# Patient Record
Sex: Female | Born: 1965 | Race: Black or African American | Hispanic: No | Marital: Single | Smoking: Former smoker
Health system: Southern US, Community
[De-identification: ages and names within clinical notes are randomized; demographics above are authoritative.]

## PROBLEM LIST (undated history)

## (undated) DIAGNOSIS — I10 Essential (primary) hypertension: Secondary | ICD-10-CM

## (undated) DIAGNOSIS — R519 Headache, unspecified: Secondary | ICD-10-CM

## (undated) DIAGNOSIS — G8929 Other chronic pain: Secondary | ICD-10-CM

## (undated) DIAGNOSIS — H9191 Unspecified hearing loss, right ear: Secondary | ICD-10-CM

## (undated) DIAGNOSIS — I639 Cerebral infarction, unspecified: Secondary | ICD-10-CM

## (undated) DIAGNOSIS — F32A Depression, unspecified: Secondary | ICD-10-CM

## (undated) DIAGNOSIS — F419 Anxiety disorder, unspecified: Secondary | ICD-10-CM

## (undated) DIAGNOSIS — M1990 Unspecified osteoarthritis, unspecified site: Secondary | ICD-10-CM

## (undated) HISTORY — PX: EXPLORATORY LAPAROTOMY: SUR591

## (undated) HISTORY — PX: ABDOMINAL HYSTERECTOMY: SUR658

## (undated) HISTORY — PX: CERVICAL SPINE SURGERY: SHX589

## (undated) HISTORY — DX: Depression, unspecified: F32.A

## (undated) HISTORY — DX: Headache, unspecified: R51.9

## (undated) HISTORY — DX: Other chronic pain: G89.29

## (undated) HISTORY — PX: OTHER SURGICAL HISTORY: SHX169

## (undated) HISTORY — PX: CHOLECYSTECTOMY: SHX55

## (undated) HISTORY — DX: Unspecified osteoarthritis, unspecified site: M19.90

## (undated) HISTORY — DX: Unspecified hearing loss, right ear: H91.91

## (undated) HISTORY — PX: BUNIONECTOMY: SHX129

## (undated) HISTORY — PX: REPLACEMENT TOTAL KNEE: SUR1224

## (undated) HISTORY — PX: WRIST SURGERY: SHX841

## (undated) HISTORY — DX: Cerebral infarction, unspecified: I63.9

## (undated) HISTORY — DX: Essential (primary) hypertension: I10

## (undated) HISTORY — DX: Anxiety disorder, unspecified: F41.9

## (undated) NOTE — Telephone Encounter (Signed)
 Formatting of this note is different from the original. Scientist, research (physical sciences) Outreach - Fit Kit was mailed over 2 months ago  Health Maintenance Due  Topic Date Due  ? Hepatitis C Screening  Never done  ? Covid-19 Vaccine (1 of 2) Never done  ? Meningitis (MEN B) vaccine (3 of 4 - Increased Risk Bexsero 2-dose series) 11/01/2016  ? Pneumococcal vaccine (3 of 4 - PPSV23) 04/18/2017  ? Shingrix vaccine (1 of 2) Never done  ? Colorectal Cancer Screening  09/19/2018  ? Monthly PHQ-9 Screening  11/17/2019  ? Flu Vaccine (1) 02/26/2020   As of today, we show that you are due/overdue for your annual preventative colorectal cancer screening.  [facility] recently mailed you an envelope with a FIT kit for you to complete this screening. Do you still have it?   N/A - Outreach Unsuccessful  Outcome: Member declined appt, complete/return fit kit, a1c and bp check one day next wk,   Electronically signed by [employer], Dawn E at 03/26/2020  4:16 PM EDT
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
## 2013-06-27 DIAGNOSIS — I639 Cerebral infarction, unspecified: Secondary | ICD-10-CM

## 2013-06-27 HISTORY — DX: Cerebral infarction, unspecified: I63.9

## 2020-01-17 ENCOUNTER — Emergency Department (HOSPITAL_COMMUNITY)
Admission: EM | Admit: 2020-01-17 | Discharge: 2020-01-17 | Discharge status: Against Medical Advice | Payer: BLUE CROSS/BLUE SHIELD

## 2020-01-17 NOTE — ED Notes (Signed)
Called pt again for triage with no response

## 2020-01-17 NOTE — ED Notes (Signed)
Called pt x5 for triage, no response. 

## 2020-05-13 ENCOUNTER — Other Ambulatory Visit: Payer: Self-pay | Admitting: Internal Medicine

## 2020-05-13 DIAGNOSIS — N6320 Unspecified lump in the left breast, unspecified quadrant: Secondary | ICD-10-CM

## 2020-08-31 ENCOUNTER — Encounter: Payer: Self-pay | Admitting: *Deleted

## 2020-09-02 ENCOUNTER — Encounter: Payer: Self-pay | Admitting: Diagnostic Neuroimaging

## 2020-09-02 ENCOUNTER — Telehealth: Payer: Self-pay | Admitting: Diagnostic Neuroimaging

## 2020-09-02 ENCOUNTER — Ambulatory Visit (INDEPENDENT_AMBULATORY_CARE_PROVIDER_SITE_OTHER): Payer: Managed Care, Other (non HMO) | Admitting: Diagnostic Neuroimaging

## 2020-09-02 VITALS — BP 152/81 | HR 61 | Ht 67.0 in | Wt 209.0 lb

## 2020-09-02 DIAGNOSIS — M542 Cervicalgia: Secondary | ICD-10-CM

## 2020-09-02 DIAGNOSIS — R531 Weakness: Secondary | ICD-10-CM

## 2020-09-02 DIAGNOSIS — R200 Anesthesia of skin: Secondary | ICD-10-CM

## 2020-09-02 NOTE — Patient Instructions (Signed)
-   check MRI brain and cervical spine

## 2020-09-02 NOTE — Telephone Encounter (Signed)
cigna order sent to GI. They will obtain the auth and reach out to the patient to schedule.  °

## 2020-09-02 NOTE — Progress Notes (Signed)
[facility]  PATIENT: Joan Pearson DOB: 05-Dec-1965  REFERRING CLINICIAN: Salli Real, MD HISTORY FROM: patient  REASON FOR VISIT: new consult    HISTORICAL  CHIEF COMPLAINT:  Chief Complaint  Patient presents with  . Chronic neck pain, LLE weakness    Rm 7 New Pt "I was told my neck pain could be neuropathy"     HISTORY OF PRESENT ILLNESS:   55 year old female here for evaluation of left-sided neck pain.  Patient reports several issues today and starts by telling me about left foot drop which started after left bunionectomy surgery in 2018.  She had electrical testing at that time confirming peripheral neuropathy.  This was done out of state in [place].  She continues to use an AFO for her left foot drop.  Her past few months patient has developed left-sided neck pain, tenderness with head rotation and left neck, arm, body and leg numbness and tingling pain.  She feels some heaviness on the left arm and left leg.  Patient reports history of stroke in 2015 where she had some type of memory lapse.    REVIEW OF SYSTEMS: Full 14 system review of systems performed and negative with exception of: As per HPI.  ALLERGIES: Allergies  Allergen Reactions  . Atorvastatin Other (See Comments)    muscle cramps, bone pain    HOME MEDICATIONS: Outpatient Medications Prior to Visit  Medication Sig Dispense Refill  . amLODipine (NORVASC) 5 MG tablet SMARTSIG:1 Tablet(s) By Mouth Every Evening    . FLUoxetine (PROZAC) 40 MG capsule Take 40 mg by mouth daily.    Marland Kitchen gabapentin (NEURONTIN) 300 MG capsule Take 300 mg by mouth 3 (three) times daily as needed.    Marland Kitchen losartan (COZAAR) 25 MG tablet 25 mg 2 (two) times daily.     No facility-administered medications prior to visit.    PAST MEDICAL HISTORY: Past Medical History:  Diagnosis Date  . Chronic neck pain   . Depression   . Headache   . Hypertension   . Stroke [facility]) 2015    PAST SURGICAL HISTORY: Past  Surgical History:  Procedure Laterality Date  . ABDOMINAL HYSTERECTOMY    . appendectomy    . CHOLECYSTECTOMY    . REPLACEMENT TOTAL KNEE Left   . spleenectomy    . WRIST SURGERY Right    torn cartilage    FAMILY HISTORY: Family History  Problem Relation Age of Onset  . Dementia Mother   . Stroke Mother   . Cancer Father   . Stroke Sister     SOCIAL HISTORY: Social History   Socioeconomic History  . Marital status: Married    Spouse name: Not on file  . Number of children: 3  . Years of education: Not on file  . Highest education level: Associate degree: academic program  Occupational History    Comment: customer service  Tobacco Use  . Smoking status: Former Smoker    Packs/day: 0.25    Quit date: 09/03/2018    Years since quitting: 2.0  . Smokeless tobacco: Never Used  Substance and Sexual Activity  . Alcohol use: Never  . Drug use: Never  . Sexual activity: Not on file  Other Topics Concern  . Not on file  Social History Narrative   Lives with husband   Caffeine- [employer] occas   Social Determinants of Health   Financial Resource Strain: Not on file  Food Insecurity: Not on file  Transportation Needs: Not on file  Physical  Activity: Not on file  Stress: Not on file  Social Connections: Not on file  Intimate Partner Violence: Not on file     PHYSICAL EXAM  GENERAL EXAM/CONSTITUTIONAL: Vitals:  Vitals:   09/02/20 0826  BP: (!) 152/81  Pulse: 61  Weight: 209 lb (94.8 kg)  Height: 5\' 7"  (1.702 m)   Body mass index is 32.73 kg/m. Wt Readings from Last 3 Encounters:  09/02/20 209 lb (94.8 kg)    Patient is in no distress; well developed, nourished and groomed; neck is supple  CARDIOVASCULAR:  Examination of carotid arteries is normal; no carotid bruits  Regular rate and rhythm, no murmurs  Examination of peripheral vascular system by observation and palpation is normal  EYES:  Ophthalmoscopic exam of optic discs and posterior segments is  normal; no papilledema or hemorrhages No exam data present  MUSCULOSKELETAL:  Gait, strength, tone, movements noted in Neurologic exam below  NEUROLOGIC: MENTAL STATUS:  No flowsheet data found.  awake, alert, oriented to person, place and time  recent and remote memory intact  normal attention and concentration  language fluent, comprehension intact, naming intact  fund of knowledge appropriate  CRANIAL NERVE:   2nd - no papilledema on fundoscopic exam  2nd, 3rd, 4th, 6th - pupils equal and reactive to light, visual fields full to confrontation, extraocular muscles intact, no nystagmus  5th - facial sensation symmetric  7th - facial strength symmetric  8th - hearing intact  9th - palate elevates symmetrically, uvula midline  11th - shoulder shrug symmetric  12th - tongue protrusion midline  MOTOR:   normal bulk and tone, full strength in the BUE, BLE; SUBTLE GIVEWAY WEAKNESS IN LEFT ARM AND LEG  SENSORY:   normal and symmetric to light touch, temperature, vibration; EXCEPT DECR IN LEFT ARM AND LEG  COORDINATION:   finger-nose-finger, fine finger movements normal  REFLEXES:   deep tendon reflexes --> BUE 2; KNEES TRACE; LEFT AFO  GAIT/STATION:   narrow based gait; able to walk on toes, heels and tandem; romberg is negative     DIAGNOSTIC DATA (LABS, IMAGING, TESTING) - I reviewed patient records, labs, notes, testing and imaging myself where available.  No results found for: WBC, HGB, HCT, MCV, PLT No results found for: NA, K, CL, CO2, GLUCOSE, BUN, CREATININE, CALCIUM, PROT, ALBUMIN, AST, ALT, ALKPHOS, BILITOT, GFRNONAA, GFRAA No results found for: CHOL, HDL, LDLCALC, LDLDIRECT, TRIG, CHOLHDL No results found for: 11/02/20 No results found for: VITAMINB12 No results found for: TSH     ASSESSMENT AND PLAN  55 y.o. year old female here with new onset of left-sided numbness, pain and weakness from her left neck down to her lower extremities.   We will proceed with further work-up to rule out cervical radiculopathy, cervical myelopathy, or other CNS etiologies such as demyelinating disease.  Dx:  1. Neck pain on left side   2. Left arm numbness   3. Left leg numbness   4. Left-sided weakness       PLAN:  - check MRI brain and cervical spine  Orders Placed This Encounter  Procedures  . MR CERVICAL SPINE W WO CONTRAST  . MR BRAIN W WO CONTRAST   Return in about 3 months (around 12/03/2020).    02/02/2021, MD 09/02/2020, 9:04 AM Certified in Neurology, Neurophysiology and Neuroimaging  [facility] [address], [address] [place], [place] [phone]

## 2020-09-13 ENCOUNTER — Other Ambulatory Visit: Payer: Self-pay

## 2020-10-03 ENCOUNTER — Encounter (HOSPITAL_COMMUNITY): Payer: Self-pay | Admitting: Emergency Medicine

## 2020-10-03 ENCOUNTER — Emergency Department (HOSPITAL_COMMUNITY)
Admission: EM | Admit: 2020-10-03 | Discharge: 2020-10-03 | Disposition: A | Discharge status: Home / Self Care | Payer: 59 | Attending: Emergency Medicine | Admitting: Emergency Medicine

## 2020-10-03 ENCOUNTER — Emergency Department (HOSPITAL_COMMUNITY): Payer: 59

## 2020-10-03 DIAGNOSIS — M25511 Pain in right shoulder: Secondary | ICD-10-CM

## 2020-10-03 DIAGNOSIS — I10 Essential (primary) hypertension: Secondary | ICD-10-CM | POA: Diagnosis not present

## 2020-10-03 DIAGNOSIS — R52 Pain, unspecified: Secondary | ICD-10-CM

## 2020-10-03 DIAGNOSIS — Z87891 Personal history of nicotine dependence: Secondary | ICD-10-CM | POA: Diagnosis not present

## 2020-10-03 DIAGNOSIS — Z79899 Other long term (current) drug therapy: Secondary | ICD-10-CM | POA: Insufficient documentation

## 2020-10-03 MED ORDER — HYDROCODONE-ACETAMINOPHEN 5-325 MG PO TABS
1.0000 | ORAL_TABLET | Freq: Once | ORAL | Status: AC
  Administered 2020-10-03: 1 via ORAL
  Filled 2020-10-03: qty 1

## 2020-10-03 MED ORDER — LIDOCAINE 5 % EX PTCH
1.0000 | MEDICATED_PATCH | Freq: Once | CUTANEOUS | Status: DC
  Administered 2020-10-03: 1 via TRANSDERMAL
  Filled 2020-10-03: qty 1

## 2020-10-03 NOTE — Discharge Instructions (Signed)
X-ray did not show any broken bones.  As we discussed you need to follow-up with orthopedics.  Call Dr. Aundria Rud and schedule an emergency room follow-up visit.  You can try over-the-counter lidocaine patches for pain.  Also you can take Tylenol and ibuprofen as needed for pain.  Take as directed on the bottle.

## 2020-10-03 NOTE — ED Notes (Signed)
ED Provider at bedside. 

## 2020-10-03 NOTE — ED Triage Notes (Signed)
Non-traumatic R shoulder pain x 2 months.  No known cause.

## 2020-10-03 NOTE — ED Provider Notes (Signed)
[facility] EMERGENCY DEPARTMENT Provider Note   CSN: [id number] Arrival date & time: 10/03/20  1155     History Chief Complaint  Patient presents with  . Shoulder Pain    Joan Pearson is a 55 y.o. female with past medical history significant for chronic neck pain, depression, headache, hypertension, stroke.  HPI Patient presents to emergency room today with chief complaint of right shoulder pain x2 months.  She states it has been intermittent.  She denies any known trauma to the shoulder.  She describes the pain as a burning sensation.  She states the pain worse with movement.  Especially repetitive movements.  She rates the pain 10 out of 10 in severity.  She has tried taking Tylenol and ibuprofen for pain at home.  She feels like it takes the edge off however does not provide significant relief.  She denies any fever, chills, numbness, tingling or weakness, chest pain, diaphoresis.  Past Medical History:  Diagnosis Date  . Chronic neck pain   . Depression   . Headache   . Hypertension   . Stroke [facility]) 2015    There are no problems to display for this patient.   Past Surgical History:  Procedure Laterality Date  . ABDOMINAL HYSTERECTOMY    . appendectomy    . CHOLECYSTECTOMY    . REPLACEMENT TOTAL KNEE Left   . spleenectomy    . WRIST SURGERY Right    torn cartilage     OB History   No obstetric history on file.     Family History  Problem Relation Age of Onset  . Dementia Mother   . Stroke Mother   . Cancer Father   . Stroke Sister     Social History   Tobacco Use  . Smoking status: Former Smoker    Packs/day: 0.25    Quit date: 09/03/2018    Years since quitting: 2.0  . Smokeless tobacco: Never Used  Substance Use Topics  . Alcohol use: Never  . Drug use: Never    Home Medications Prior to Admission medications   Medication Sig Start Date End Date Taking? Authorizing Provider  amLODipine (NORVASC) 5 MG tablet SMARTSIG:1  Tablet(s) By Mouth Every Evening 06/22/20   [provider]  FLUoxetine (PROZAC) 40 MG capsule Take 40 mg by mouth daily. 06/23/20   [provider]  gabapentin (NEURONTIN) 300 MG capsule Take 300 mg by mouth 3 (three) times daily as needed. 06/23/20   [provider]  losartan (COZAAR) 25 MG tablet 25 mg 2 (two) times daily. 05/06/20   [provider]    Allergies    Atorvastatin  Review of Systems   Review of Systems All other systems are reviewed and are negative for acute change except as noted in the HPI.  Physical Exam Updated Vital Signs BP 139/78 (BP Location: Left Arm)   Pulse 74   Temp 99.1 F (37.3 C) (Oral)   Resp 18   SpO2 99%   Physical Exam Vitals and nursing note reviewed.  Constitutional:      Appearance: She is well-developed. She is not ill-appearing or toxic-appearing.  HENT:     Head: Normocephalic and atraumatic.     Nose: Nose normal.  Eyes:     General: No scleral icterus.       Right eye: No discharge.        Left eye: No discharge.     Conjunctiva/sclera: Conjunctivae normal.  Neck:  Vascular: No JVD.  Cardiovascular:     Rate and Rhythm: Normal rate and regular rhythm.     Pulses: Normal pulses.          Radial pulses are 2+ on the right side and 2+ on the left side.     Heart sounds: Normal heart sounds.  Pulmonary:     Effort: Pulmonary effort is normal.     Breath sounds: Normal breath sounds.  Abdominal:     General: There is no distension.  Musculoskeletal:        General: Normal range of motion.       Arms:     Cervical back: Normal range of motion.     Comments: Ender to palpation as depicted in image above.  No overlying skin changes.  No swelling, deformity, effusion, crepitus.  Positive Neer test.  Negative speed's test.  Negative sulcus test.  Full range of motion of right shoulder, elbow and wrist.  Compartments are soft.  Strong equal grip strength in bilateral upper extremities.   Skin:    General: Skin is warm and dry.     Capillary Refill: Capillary refill takes less than 2 seconds.  Neurological:     Mental Status: She is oriented to person, place, and time.     GCS: GCS eye subscore is 4. GCS verbal subscore is 5. GCS motor subscore is 6.     Comments: Fluent speech, no facial droop.  Psychiatric:        Behavior: Behavior normal.     ED Results / Procedures / Treatments   Labs (all labs ordered are listed, but only abnormal results are displayed) Labs Reviewed - No data to display  EKG None  Radiology DG Shoulder Right  Result Date: 10/03/2020 CLINICAL DATA:  Chronic three-month history of RIGHT shoulder pain which has recently worsened and is now associated with crepitus. EXAM: RIGHT SHOULDER - 2+ VIEW COMPARISON:  None. FINDINGS: No evidence of acute, subacute or healed fracture. Glenohumeral joint anatomically aligned with well-preserved joint space. Subacromial space well-preserved. Acromioclavicular joint anatomically aligned without significant degenerative changes. Well preserved bone mineral density. No intrinsic osseous abnormality. IMPRESSION: Normal examination. Electronically Signed   By: Hulan Saas M.D.   On: 10/03/2020 13:38    Procedures Procedures   Medications Ordered in ED Medications  lidocaine (LIDODERM) 5 % 1 patch (1 patch Transdermal Patch Applied 10/03/20 1357)  HYDROcodone-acetaminophen (NORCO/VICODIN) 5-325 MG per tablet 1 tablet (1 tablet Oral Given 10/03/20 1357)    ED Course  I have reviewed the triage vital signs and the nursing notes.  Pertinent labs & imaging results that were available during my care of the patient were reviewed by me and considered in my medical decision making (see chart for details).    MDM Rules/Calculators/A&P                          History provided by patient with additional history obtained from chart review.    Patient presents to the ED with complaints of pain to the right  shoulder without known injury. Exam without obvious deformity or open wounds. ROM intact. Tender to palpation over AC joint.  Positive Neer's test.  NVI distally. Xray viewed by me is negative for fracture/dislocation.  Agree with radiologist impression.  Exam not suggestive of septic joint either.  Patient given dose of pain medicine and lidocaine patch here.  Recommend Tylenol Motrin for pain as needed.  Will  have patient follow-up with orthopedics for ongoing pain.I discussed results, treatment plan, need for follow-up, and return precautions with the patient. Provided opportunity for questions, patient confirmed understanding and are in agreement with plan.    Portions of this note were generated with Scientist, clinical (histocompatibility and immunogenetics). Dictation errors may occur despite best attempts at proofreading.   Final Clinical Impression(s) / ED Diagnoses Final diagnoses:  Acute pain of right shoulder    Rx / DC Orders ED Discharge Orders    None       Shanon Ace, PA-C 10/03/20 1438    Sabino Donovan, MD 10/04/20 [phone]

## 2020-12-15 ENCOUNTER — Ambulatory Visit: Payer: 59 | Admitting: Diagnostic Neuroimaging

## 2021-05-05 ENCOUNTER — Other Ambulatory Visit: Payer: Self-pay

## 2021-05-05 ENCOUNTER — Encounter: Payer: Self-pay | Admitting: Physician Assistant

## 2021-05-05 ENCOUNTER — Ambulatory Visit (INDEPENDENT_AMBULATORY_CARE_PROVIDER_SITE_OTHER): Payer: 59 | Admitting: Physician Assistant

## 2021-05-05 ENCOUNTER — Telehealth: Payer: Self-pay | Admitting: Physician Assistant

## 2021-05-05 DIAGNOSIS — M79641 Pain in right hand: Secondary | ICD-10-CM

## 2021-05-05 DIAGNOSIS — S62336A Displaced fracture of neck of fifth metacarpal bone, right hand, initial encounter for closed fracture: Secondary | ICD-10-CM

## 2021-05-05 MED ORDER — HYDROCODONE-ACETAMINOPHEN 5-325 MG PO TABS: 1.0000 | ORAL_TABLET | Freq: Four times a day (QID) | ORAL | 0 refills | Status: DC | PRN

## 2021-05-05 NOTE — Telephone Encounter (Signed)
Is this where you sent it?

## 2021-05-05 NOTE — Addendum Note (Signed)
Addended by: Richardean Canal on: 05/05/2021 02:51 PM   Modules accepted: Orders
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
## 2021-05-05 NOTE — Telephone Encounter (Signed)
Please send the hydrocodone to [facility] on [facility]. Please call pt when sent in. Pt phone number is [phone].

## 2021-05-12 ENCOUNTER — Ambulatory Visit (INDEPENDENT_AMBULATORY_CARE_PROVIDER_SITE_OTHER): Payer: 59 | Admitting: Physician Assistant

## 2021-05-12 ENCOUNTER — Encounter: Payer: Self-pay | Admitting: Physician Assistant

## 2021-05-12 ENCOUNTER — Other Ambulatory Visit: Payer: Self-pay

## 2021-05-12 ENCOUNTER — Ambulatory Visit: Payer: Self-pay

## 2021-05-12 DIAGNOSIS — S62336A Displaced fracture of neck of fifth metacarpal bone, right hand, initial encounter for closed fracture: Secondary | ICD-10-CM | POA: Diagnosis not present

## 2021-05-12 NOTE — Progress Notes (Signed)
  HPI: [name] returns today early status post right fifth metacarpal fracture.  She is overall doing well.  She states that she has been wearing the ulnar gutter splint only removing it for hygiene purposes.  Physical exam: Right hand ecchymosis about the fourth and fifth phalanx base has improved.  She has tenderness over the distal fifth metacarpal shaft region.  There is no tenting of the skin.  Hand is neurovascular intact.  Radiographs: 3 views right hand show no change in overall position alignment of the fifth metatarsal carpal neck fracture.  Very early signs of consolidation are seen.  No other fractures identified.  Impression: Closed displaced fracture fifth metacarpal neck.  Plan: She will continue to use the ulnar gutter splint taking off only for hand hygiene.  We will see her back in 1 month at that time we will obtain 3 views of the right hand.  Questions encouraged and answered.

## 2021-06-09 ENCOUNTER — Encounter: Payer: Self-pay | Admitting: Orthopaedic Surgery

## 2021-06-09 ENCOUNTER — Other Ambulatory Visit: Payer: Self-pay

## 2021-06-09 ENCOUNTER — Ambulatory Visit (INDEPENDENT_AMBULATORY_CARE_PROVIDER_SITE_OTHER): Payer: 59 | Admitting: Orthopaedic Surgery

## 2021-06-09 ENCOUNTER — Ambulatory Visit (INDEPENDENT_AMBULATORY_CARE_PROVIDER_SITE_OTHER): Payer: 59

## 2021-06-09 DIAGNOSIS — M79641 Pain in right hand: Secondary | ICD-10-CM

## 2021-06-09 NOTE — Progress Notes (Signed)
The patient is now just past 5 weeks into a right dominant hand fifth metacarpal neck fracture.  This was essentially a boxer's fracture.  She has been in a ulnar gutter removable splint.  She is having pain with motion of the hand and reports decreased motion of her fifth finger.  On examination her hands are still some slight swelling but there is no rotational deformities and I can flex and extend her right fifth finger at the MCP joint.  She is neurovascular intact.  3 views of the right hand show that the fracture is beginning to heal and the overall alignment is acceptable especially on the lateral view.  She will continue her removable Velcro ulnar gutter splint for protection but I want her to work on squeezing a ball a few times a day and passive motion of her hand.  She will still avoid heavy impact activities with the right hand.  We will see her back in 4 weeks for potentially final x-rays with repeat 3 views of the right hand.  We may consider hand therapy at that standpoint if needed.

## 2021-07-07 ENCOUNTER — Other Ambulatory Visit: Payer: Self-pay

## 2021-07-07 ENCOUNTER — Ambulatory Visit (INDEPENDENT_AMBULATORY_CARE_PROVIDER_SITE_OTHER): Payer: 59

## 2021-07-07 ENCOUNTER — Encounter: Payer: Self-pay | Admitting: Orthopaedic Surgery

## 2021-07-07 ENCOUNTER — Ambulatory Visit (INDEPENDENT_AMBULATORY_CARE_PROVIDER_SITE_OTHER): Payer: 59 | Admitting: Orthopaedic Surgery

## 2021-07-07 DIAGNOSIS — S62336D Displaced fracture of neck of fifth metacarpal bone, right hand, subsequent encounter for fracture with routine healing: Secondary | ICD-10-CM

## 2021-07-07 DIAGNOSIS — S62336A Displaced fracture of neck of fifth metacarpal bone, right hand, initial encounter for closed fracture: Secondary | ICD-10-CM

## 2021-07-07 NOTE — Progress Notes (Signed)
The patient is 9 weeks out from a right hand fifth metacarpal neck fracture.  She says she does have some pain every now and then but overall is doing well.  There is a little bit of numbness but she can make a full fist as well.  She has full flexion-extension of all fingers and thumb of her right hand.  There is no rotation deformity of the fifth ray.  She can abduct and adduct.  The fracture is stable my exam.  3 views of the left hand show that the fifth metacarpal fracture is healed completely and is in good alignment.  From a hand standpoint she will follow-up as needed.  This will continue to improve with time.  As she was checking out she did let me know that she has a history of a left knee replacement that is over 38 years old.  She has been having swelling with that knee.  This was done in [place].  She is also having right knee issues.  I went to have her reschedule an appointment with me to address her knees.  At that visit we will have an AP and lateral both her knees.

## 2021-07-26 ENCOUNTER — Ambulatory Visit: Payer: 59 | Admitting: Orthopaedic Surgery

## 2021-07-26 ENCOUNTER — Ambulatory Visit (INDEPENDENT_AMBULATORY_CARE_PROVIDER_SITE_OTHER): Payer: 59 | Admitting: Physician Assistant

## 2021-07-26 ENCOUNTER — Ambulatory Visit: Payer: Self-pay

## 2021-07-26 ENCOUNTER — Other Ambulatory Visit: Payer: Self-pay

## 2021-07-26 ENCOUNTER — Encounter: Payer: Self-pay | Admitting: Physician Assistant

## 2021-07-26 DIAGNOSIS — M25562 Pain in left knee: Secondary | ICD-10-CM

## 2021-07-26 DIAGNOSIS — R200 Anesthesia of skin: Secondary | ICD-10-CM

## 2021-07-26 NOTE — Progress Notes (Signed)
Office Visit Note   Patient: Joan Pearson           Date of Birth: May 13, 1966           MRN: [id number] Visit Date: 07/26/2021              Requested by: Olevia Perches, NP [address] [place],  [place] [address] PCP: Olevia Perches, NP   Assessment & Plan: Visit Diagnoses:  1. Left knee pain, unspecified chronicity   2. Numbness of right hand     Plan:  Given her mixed exam and symptoms.  We will obtain EMG nerve conduction studies to evaluate numbness of the right hand.  At her back once the studies are available.  Questions were encouraged and answered.  In regards to the knee reassurance was given that there is no radiographic findings to indicate loosening involving the left knee components or fracture.  Given the fact that she has swelling in the leg mostly with this activity recommend she wear compression stockings and I would encourage her to wear a thigh-high compression.  Questions were encouraged and answered. Recommend she work on quad strengthening exercises and this is discussed with her.  Follow-Up Instructions: Return After EMG/[place] studies.   Orders:  Orders Placed This Encounter  Procedures   XR Knee 1-2 Views Left   No orders of the defined types were placed in this encounter.     Procedures: No procedures performed   Clinical Data: No additional findings.   Subjective: Chief Complaint  Patient presents with   Right Hand - Pain   Left Knee - Pain    HPI Joan Pearson is 56 year old female who sustained a right fifth metacarpal fracture on 05/03/2021.  She comes in today for follow-up.  States she is having numbness in the right hand.  Describes numbness in the hand she holds objects for long time.  She states also with the numbness in the hand awakens her.  She has difficulty explaining exactly where the numbness and tingling has but does state that the index and long finger go numb on the tips.  She denies any radicular symptoms down the arm.  She has  had no new injury to the hand. Patient also is requesting that we look at her left knee.  She has pain in the left knee and swelling in her entire left leg.  She states she has difficulty wearing close due to swelling left leg.  History of left total knee arthroplasty some 10 years ago.  She does also report a history of left dropfoot that resolved.  She states she had a cyst in the back of her knee that was pressing up against a nerve that caused her foot drop.  She has had no falls no new injuries to the left knee.  Review of Systems   Objective: Vital Signs: There were no vitals taken for this visit.  Physical Exam Constitutional:      Appearance: She is not ill-appearing or diaphoretic.  Pulmonary:     Effort: Pulmonary effort is normal.  Neurological:     Mental Status: She is alert and oriented to person, place, and time.  Psychiatric:        Mood and Affect: Mood normal.    Ortho Exam Bilateral upper extremities: Bilateral hands full motor.  Subjective decrease sensation over the fourth and fifth fingers with light touch right hand only.  Negative Tinel's over the ulnar nerve at the elbow and at  the median nerve at the wrist bilaterally.  Negative compression test over the median nerve at the wrist bilaterally.  Phalen's is negative bilaterally.  No atrophy in either hand. Bilateral lower extremities calves are supple nontender.  No edema is appreciated in the left leg compared to the right.  There is no rashes skin lesions ulcerations.  Left knee surgical incisions well-healed.  Excellent range of motion of the left knee slight laxity with valgus varus stressing left knee.  Anterior drawer is negative bilaterally.  Good range of motion left knee without pain. Specialty Comments:  No specialty comments available.  Imaging: XR Knee 1-2 Views Left  Result Date: 07/26/2021 Left knee AP lateral views: Status post left total knee arthroplasty with well-seated components.  No acute  fractures bony abnormalities.  Knee is well located.    PMFS History: There are no problems to display for this patient.  Past Medical History:  Diagnosis Date   Chronic neck pain    Depression    Headache    Hypertension    Stroke ([facility]) 2015    Family History  Problem Relation Age of Onset   Dementia Mother    Stroke Mother    Cancer Father    Stroke Sister     Past Surgical History:  Procedure Laterality Date   ABDOMINAL HYSTERECTOMY     appendectomy     CHOLECYSTECTOMY     REPLACEMENT TOTAL KNEE Left    spleenectomy     WRIST SURGERY Right    torn cartilage   Social History   Occupational History    Comment: customer service  Tobacco Use   Smoking status: Former    Packs/day: 0.25    Types: Cigarettes    Quit date: 09/03/2018    Years since quitting: 2.8   Smokeless tobacco: Never  Substance and Sexual Activity   Alcohol use: Never   Drug use: Never   Sexual activity: Not on file

## 2021-07-30 ENCOUNTER — Other Ambulatory Visit: Payer: Self-pay

## 2021-07-30 ENCOUNTER — Telehealth: Payer: Self-pay | Admitting: Orthopaedic Surgery

## 2021-07-30 DIAGNOSIS — R200 Anesthesia of skin: Secondary | ICD-10-CM

## 2021-07-30 NOTE — Telephone Encounter (Signed)
Pt called and would like a referral to [facility] for a NCS

## 2021-08-04 ENCOUNTER — Ambulatory Visit: Payer: 59 | Admitting: Orthopaedic Surgery

## 2021-08-13 ENCOUNTER — Encounter: Payer: 59 | Admitting: Physical Medicine and Rehabilitation

## 2021-08-30 ENCOUNTER — Telehealth: Payer: Self-pay | Admitting: Physical Medicine and Rehabilitation

## 2021-08-30 NOTE — Telephone Encounter (Signed)
Patient called needing to schedule an appointment with Dr Ernestina Patches for a NCS on her hand. The number to contact patient is [phone]. ?

## 2021-09-09 LAB — HM COLONOSCOPY

## 2021-09-14 ENCOUNTER — Encounter: Payer: 59 | Admitting: Physical Medicine and Rehabilitation

## 2021-12-23 ENCOUNTER — Encounter (HOSPITAL_COMMUNITY): Payer: Self-pay

## 2021-12-23 ENCOUNTER — Emergency Department (HOSPITAL_COMMUNITY): Payer: BC Managed Care – PPO

## 2021-12-23 ENCOUNTER — Emergency Department (HOSPITAL_COMMUNITY)
Admission: EM | Admit: 2021-12-23 | Discharge: 2021-12-23 | Disposition: A | Discharge status: Home / Self Care | Payer: BC Managed Care – PPO | Attending: Emergency Medicine | Admitting: Emergency Medicine

## 2021-12-23 ENCOUNTER — Other Ambulatory Visit: Payer: Self-pay

## 2021-12-23 DIAGNOSIS — I639 Cerebral infarction, unspecified: Secondary | ICD-10-CM | POA: Diagnosis not present

## 2021-12-23 DIAGNOSIS — I10 Essential (primary) hypertension: Secondary | ICD-10-CM | POA: Insufficient documentation

## 2021-12-23 DIAGNOSIS — M5030 Other cervical disc degeneration, unspecified cervical region: Secondary | ICD-10-CM | POA: Diagnosis not present

## 2021-12-23 DIAGNOSIS — E875 Hyperkalemia: Secondary | ICD-10-CM | POA: Insufficient documentation

## 2021-12-23 DIAGNOSIS — Z79899 Other long term (current) drug therapy: Secondary | ICD-10-CM | POA: Insufficient documentation

## 2021-12-23 DIAGNOSIS — R209 Unspecified disturbances of skin sensation: Secondary | ICD-10-CM | POA: Diagnosis not present

## 2021-12-23 DIAGNOSIS — D72829 Elevated white blood cell count, unspecified: Secondary | ICD-10-CM | POA: Insufficient documentation

## 2021-12-23 DIAGNOSIS — R5383 Other fatigue: Secondary | ICD-10-CM | POA: Diagnosis not present

## 2021-12-23 LAB — COMPREHENSIVE METABOLIC PANEL
ALT: 16 U/L (ref 0–44)
AST: 35 U/L (ref 15–41)
Albumin: 4.3 g/dL (ref 3.5–5.0)
Alkaline Phosphatase: 78 U/L (ref 38–126)
Anion gap: 7 (ref 5–15)
BUN: 8 mg/dL (ref 6–20)
CO2: 26 mmol/L (ref 22–32)
Calcium: 9.4 mg/dL (ref 8.9–10.3)
Chloride: 106 mmol/L (ref 98–111)
Creatinine, Ser: 0.76 mg/dL (ref 0.44–1.00)
GFR, Estimated: >60 mL/min (ref >60)
Glucose, Bld: 96 mg/dL (ref 70–99)
Potassium: 5.6 mmol/L — ABNORMAL HIGH (ref 3.5–5.1)
Sodium: 139 mmol/L (ref 135–145)
Total Bilirubin: 1.3 mg/dL — ABNORMAL HIGH (ref 0.3–1.2)
Total Protein: 7.2 g/dL (ref 6.5–8.1)

## 2021-12-23 LAB — DIFFERENTIAL
Abs Immature Granulocytes: 0.02 10*3/uL (ref 0.00–0.07)
Basophils Absolute: 0 10*3/uL (ref 0.0–0.1)
Basophils Relative: 0 %
Eosinophils Absolute: 0.1 10*3/uL (ref 0.0–0.5)
Eosinophils Relative: 1 %
Immature Granulocytes: 0 %
Lymphocytes Relative: 31 %
Lymphs Abs: 3.3 10*3/uL (ref 0.7–4.0)
Monocytes Absolute: 0.9 10*3/uL (ref 0.1–1.0)
Monocytes Relative: 8 %
Neutro Abs: 6.3 10*3/uL (ref 1.7–7.7)
Neutrophils Relative %: 60 %

## 2021-12-23 LAB — CBC
HCT: 38.4 % (ref 36.0–46.0)
Hemoglobin: 12.6 g/dL (ref 12.0–15.0)
MCH: 27.2 pg (ref 26.0–34.0)
MCHC: 32.8 g/dL (ref 30.0–36.0)
MCV: 82.9 fL (ref 80.0–100.0)
Platelets: 280 10*3/uL (ref 150–400)
RBC: 4.63 MIL/uL (ref 3.87–5.11)
RDW: 14.3 % (ref 11.5–15.5)
WBC: 10.6 10*3/uL — ABNORMAL HIGH (ref 4.0–10.5)
nRBC: 0 % (ref 0.0–0.2)

## 2021-12-23 LAB — POTASSIUM: Potassium: 3.7 mmol/L (ref 3.5–5.1)

## 2021-12-23 MED ORDER — LORAZEPAM 2 MG/ML IJ SOLN: 1.0000 mg | Freq: Once | INTRAMUSCULAR | Status: DC | PRN

## 2021-12-23 MED ORDER — LORAZEPAM 1 MG PO TABS
1.0000 mg | ORAL_TABLET | Freq: Once | ORAL | Status: AC | PRN
  Administered 2021-12-23: 1 mg via ORAL
  Filled 2021-12-23: qty 1

## 2021-12-23 MED ORDER — AMLODIPINE BESYLATE 5 MG PO TABS: 5.0000 mg | ORAL_TABLET | Freq: Every day | ORAL | 0 refills | Status: DC

## 2021-12-23 MED ORDER — AMLODIPINE BESYLATE 5 MG PO TABS
5.0000 mg | ORAL_TABLET | Freq: Once | ORAL | Status: AC
  Administered 2021-12-23: 5 mg via ORAL
  Filled 2021-12-23: qty 1

## 2021-12-23 NOTE — ED Triage Notes (Signed)
Pt c/o constant HA x several weeks, increased forgetfulness, HTN, blue flashing light in R eye; BP 189/100 at home, endorses some CP; denies weakness; hx HTN; states she stopped taking BP meds x 6 months ago, has been taking garlic and ginger; "feels like a gas bubble" in center of chest; denies N/V; hx TIA 7 years ago

## 2021-12-23 NOTE — ED Notes (Signed)
Patient verbalizes understanding of discharge instructions. Opportunity for questioning and answers were provided. Pt discharged from ED. 

## 2021-12-23 NOTE — ED Notes (Signed)
Pt ambulatory to and from restroom with steady gait 

## 2021-12-23 NOTE — ED Provider Notes (Signed)
[facility] EMERGENCY DEPARTMENT Provider Note   CSN: [id number] Arrival date & time: 12/23/21  0809     History  Chief Complaint  Patient presents with   Hypertension    Joan Pearson is a 56 y.o. female.   Hypertension  Patient has history of hypertension, headache, and chronic neck pain, depression, stroke.  Patient states she has a history of hypertension.  She had been on medications but ended up taking herself off her medications around 6 months ago.  Patient states she modified her diet.  She checks her blood pressure and it is still elevated but usually 150s systolic.  This morning she took her blood pressure and noted it was up into the 180s over 100.  She denies any chest pain now but states she has been having some intermittent issues of having discomfort in her chest that she says feels like gas.  She feels like she has to burp and it will get better.  She denies any nausea vomiting.  She has been feeling overall fatigued.  She is not having any trouble with her gait balance or coordination but she states she is having increasing forgetfulness.  Patient also states she feels some tingling in the fourth and fifth fingers of her hand but that resolves whenever she extends her arm.  That has been ongoing issue as well and not new associated with her hypertension this morning.  Patient also has noted some intermittent blue light flashing lights in her right eye.  She is not having any visual difficulties right now.  Patient has not seen anyone for any of these in symptoms until this morning when she became concerned with the very elevated blood pressure.     Home Medications Prior to Admission medications   Medication Sig Start Date End Date Taking? Authorizing Provider  amLODipine (NORVASC) 5 MG tablet Take 1 tablet (5 mg total) by mouth daily. 12/23/21  Yes Linwood Dibbles, MD  Multiple Vitamin (MULTIVITAMIN) capsule Take 1 capsule by mouth daily.   Yes [provider]  Oxymetazoline HCl (VICKS SINEX 12 HOUR NA) Place 1 spray into the nose daily as needed (nasal congestion).   Yes [provider]  vitamin B-12 (CYANOCOBALAMIN) 1000 MCG tablet Take 1,000 mcg by mouth daily.   Yes [provider]      Allergies    Atorvastatin    Review of Systems   Review of Systems  Constitutional:  Negative for fever.    Physical Exam Updated Vital Signs BP (!) 154/76   Pulse 60   Temp 98.6 F (37 C) (Oral)   Resp 14   Ht 1.702 m (5\' 7" )   Wt 91.6 kg   SpO2 100%   BMI 31.64 kg/m  Physical Exam Vitals and nursing note reviewed.  Constitutional:      General: She is not in acute distress.    Appearance: She is well-developed.  HENT:     Head: Normocephalic and atraumatic.     Right Ear: External ear normal.     Left Ear: External ear normal.  Eyes:     General: No scleral icterus.       Right eye: No discharge.        Left eye: No discharge.     Conjunctiva/sclera: Conjunctivae normal.  Neck:     Trachea: No tracheal deviation.  Cardiovascular:     Rate and Rhythm: Normal rate and regular rhythm.  Pulmonary:     Effort: Pulmonary  effort is normal. No respiratory distress.     Breath sounds: Normal breath sounds. No stridor. No wheezing or rales.  Abdominal:     General: Bowel sounds are normal. There is no distension.     Palpations: Abdomen is soft.     Tenderness: There is no abdominal tenderness. There is no guarding or rebound.  Musculoskeletal:        General: No tenderness.     Cervical back: Neck supple.  Skin:    General: Skin is warm and dry.     Findings: No rash.  Neurological:     Mental Status: She is alert and oriented to person, place, and time.     Cranial Nerves: No cranial nerve deficit.     Sensory: No sensory deficit.     Motor: No abnormal muscle tone or seizure activity.     Coordination: Coordination normal.     Comments: No pronator drift bilateral upper extrem, able to hold both  legs off bed for 5 seconds, sensation intact in all extremities, no visual field cuts, no left or right sided neglect, normal finger-nose exam bilaterally, no nystagmus noted  No facial droop, extraocular movements intact, tongue midline     ED Results / Procedures / Treatments   Labs (all labs ordered are listed, but only abnormal results are displayed) Labs Reviewed  CBC - Abnormal; Notable for the following components:      Result Value   WBC 10.6 (*)    All other components within normal limits  COMPREHENSIVE METABOLIC PANEL - Abnormal; Notable for the following components:   Potassium 5.6 (*)    Total Bilirubin 1.3 (*)    All other components within normal limits  DIFFERENTIAL  POTASSIUM    EKG EKG Interpretation  Date/Time:  Thursday December 23 2021 08:42:06 EDT Ventricular Rate:  55 PR Interval:  210 QRS Duration: 98 QT Interval:  497 QTC Calculation: 476 R Axis:   65 Text Interpretation: Sinus rhythm Borderline prolonged PR interval RSR' in V1 or V2, probably normal variant No old tracing to compare Confirmed by Dorie Rank [phone]) on 12/23/2021 8:53:06 AM  Radiology MR BRAIN WO CONTRAST  Result Date: 12/23/2021 CLINICAL DATA:  Neuro deficit, acute, stroke suspected EXAM: MRI HEAD WITHOUT CONTRAST TECHNIQUE: Multiplanar, multiecho pulse sequences of the brain and surrounding structures were obtained without intravenous contrast. COMPARISON:  None Available. FINDINGS: Brain: There is no acute infarction or intracranial hemorrhage. There is no intracranial mass, mass effect, or edema. There is no hydrocephalus or extra-axial fluid collection. Ventricles and sulci are normal in size and configuration. Punctate focus of susceptibility in the medial left temporal likely reflects a chronic microhemorrhage. Patchy foci of T2 hyperintensity in the supratentorial white matter are nonspecific but may reflect mild chronic microvascular ischemic changes. Vascular: Major vessel flow voids  at the skull base are preserved. Skull and upper cervical spine: Normal marrow signal is preserved. Sinuses/Orbits: Paranasal sinuses are aerated. Orbits are unremarkable. Other: Sella is unremarkable.  Mastoid air cells are clear. IMPRESSION: No acute infarction, hemorrhage, or mass. Mild chronic microvascular ischemic changes. Electronically Signed   By: Macy Mis M.D.   On: 12/23/2021 12:06   CT HEAD WO CONTRAST  Result Date: 12/23/2021 CLINICAL DATA:  56 year old female with intermittent pressure in the head for 2 months. Hypertensive today. EXAM: CT HEAD WITHOUT CONTRAST TECHNIQUE: Contiguous axial images were obtained from the base of the skull through the vertex without intravenous contrast. RADIATION DOSE REDUCTION: This exam was  performed according to the departmental dose-optimization program which includes automated exposure control, adjustment of the mA and/or kV according to patient size and/or use of iterative reconstruction technique. COMPARISON:  None Available. FINDINGS: Brain: Normal cerebral volume. No midline shift, ventriculomegaly, mass effect, evidence of mass lesion, intracranial hemorrhage or evidence of cortically based acute infarction. Patchy asymmetric hypodensity in the anterior limb left internal white matter capsule, and similar patchy hypodensity adjacent to the left frontal horn. But otherwise preserved gray-white matter differentiation. No cortical encephalomalacia identified. Vascular: No suspicious intracranial vascular hyperdensity. Skull: No acute osseous abnormality identified. Sinuses/Orbits: Paranasal sinuses are well aerated. Mild mucosal thickening right maxillary sinus. Tympanic cavities and mastoids are clear. Other: No acute orbit or scalp soft tissue finding. IMPRESSION: 1. Asymmetric white matter disease in the left hemisphere, most commonly due to small vessel ischemia. 2. But otherwise negative, no acute intracranial abnormality by CT. Electronically Signed    By: Odessa Fleming M.D.   On: 12/23/2021 09:37   DG Cervical Spine Complete  Result Date: 12/23/2021 CLINICAL DATA:  Paresthesias RIGHT hand, constant headache for several weeks EXAM: CERVICAL SPINE - COMPLETE 4+ VIEW COMPARISON:  None FINDINGS: Osseous demineralization. Prevertebral soft tissues normal thickness. Vertebral body heights maintained without fracture or subluxation. Disc space narrowing with endplate spur formation at C5-C6. Scattered facet degenerative changes. Uncovertebral spurs encroach upon the BILATERAL C5-C6 neural foramina greater on LEFT and BILATERAL C6-C7 neural foramina greater on RIGHT. Scattered facet degenerative changes. Lung apices clear. IMPRESSION: Degenerative changes cervical spine greatest at C5-C6 and C6-C7 as above. Electronically Signed   By: Ulyses Southward M.D.   On: 12/23/2021 09:32    Procedures Procedures    Medications Ordered in ED Medications  amLODipine (NORVASC) tablet 5 mg (5 mg Oral Given 12/23/21 1039)  LORazepam (ATIVAN) tablet 1 mg (1 mg Oral Given 12/23/21 1119)    ED Course/ Medical Decision Making/ A&P Clinical Course as of 12/23/21 1350  Thu Dec 23, 2021  1018 Comprehensive metabolic panel(!) Potassium level slightly increased at 5.6.  Bilirubin also slightly elevated [JK]  1019 CBC(!) White blood cell count slightly elevated no anemia [JK]  1019 DG Cervical Spine Complete Degenerative changes noted in the C-spine [JK]  1019 CT HEAD WO CONTRAST Head CT without acute stroke [JK]  1316 Potassium  Repeat potassium normal [JK]  1317 MR BRAIN WO CONTRAST MRI without acute findings.  Chronic microvascular changes noted [JK]    Clinical Course User Index [JK] Linwood Dibbles, MD                           Medical Decision Making Differential diagnosis includes but not limited to stroke, tumor, electrolyte abnormality, anemia  Problems Addressed: Disc disease, degenerative, cervical: chronic illness or injury with exacerbation, progression, or  side effects of treatment Hypertension, unspecified type: complicated acute illness or injury with systemic symptoms  Amount and/or Complexity of Data Reviewed Labs: ordered. Decision-making details documented in ED Course. Radiology: ordered and independent interpretation performed. Decision-making details documented in ED Course.  Risk Prescription drug management.   Patient's ED work-up is reassuring.  No signs of any acute stroke.  No evidence of brain tumor.  No signs of endorgan ischemia associated with her hypertension.  Patient does have a history of hypertension but has not been taking medications for a while at this point.  Discussed these findings with her.  Recommend resuming blood pressure medications.  We will have her continue  to monitor her blood pressure several times a day and follow-up with a primary care doctor.  Low dose Norvasc prescribed.  Patient did tolerate a dose here in the emergency room.  She also describes some neck discomfort as well as some intermittent paresthesias in her fingers.  She has no focal neurologic deficit on exam.  X-rays do show findings suggestive of degenerative disc disease.  This might be causing somewhat of a cervical radiculopathy.  Recommend outpatient follow-up with PCP or primary doctor.        Final Clinical Impression(s) / ED Diagnoses Final diagnoses:  Hypertension, unspecified type  Disc disease, degenerative, cervical    Rx / DC Orders ED Discharge Orders          Ordered    amLODipine (NORVASC) 5 MG tablet  Daily        12/23/21 1349              Dorie Rank, MD 12/23/21 1352

## 2021-12-23 NOTE — Discharge Instructions (Signed)
Continue to monitor your blood pressure as we discussed.  If your blood pressure remains elevated start taking the Norvasc.  Take over-the-counter medications as needed for the neck discomfort.  Consider following up with a primary care doctor or spine doctor for further evaluation if the symptoms of neck pain and tingling in her fingers persist.  Follow-up with primary care doctor to review your blood pressure

## 2021-12-27 ENCOUNTER — Other Ambulatory Visit: Payer: Self-pay | Admitting: Family Medicine

## 2021-12-27 DIAGNOSIS — M5412 Radiculopathy, cervical region: Secondary | ICD-10-CM

## 2021-12-29 ENCOUNTER — Ambulatory Visit
Admission: RE | Admit: 2021-12-29 | Discharge: 2021-12-29 | Disposition: A | Discharge status: Home / Self Care | Payer: BC Managed Care – PPO | Source: Ambulatory Visit | Attending: Family Medicine | Admitting: Family Medicine

## 2021-12-29 DIAGNOSIS — M5412 Radiculopathy, cervical region: Secondary | ICD-10-CM

## 2021-12-29 MED ORDER — GADOBENATE DIMEGLUMINE 529 MG/ML IV SOLN
20.0000 mL | Freq: Once | INTRAVENOUS | Status: AC | PRN
  Administered 2021-12-29: 20 mL via INTRAVENOUS

## 2022-08-01 LAB — HM MAMMOGRAPHY

## 2022-09-20 ENCOUNTER — Other Ambulatory Visit (HOSPITAL_BASED_OUTPATIENT_CLINIC_OR_DEPARTMENT_OTHER): Payer: Self-pay | Admitting: Registered Nurse

## 2022-09-20 DIAGNOSIS — H8110 Benign paroxysmal vertigo, unspecified ear: Secondary | ICD-10-CM

## 2022-09-30 ENCOUNTER — Ambulatory Visit (HOSPITAL_BASED_OUTPATIENT_CLINIC_OR_DEPARTMENT_OTHER): Payer: BC Managed Care – PPO

## 2022-10-04 ENCOUNTER — Emergency Department (HOSPITAL_COMMUNITY): Payer: BC Managed Care – PPO

## 2022-10-04 ENCOUNTER — Other Ambulatory Visit (HOSPITAL_COMMUNITY): Payer: BC Managed Care – PPO

## 2022-10-04 ENCOUNTER — Encounter (HOSPITAL_COMMUNITY): Payer: Self-pay

## 2022-10-04 ENCOUNTER — Emergency Department (HOSPITAL_COMMUNITY)
Admission: EM | Admit: 2022-10-04 | Discharge: 2022-10-04 | Disposition: A | Discharge status: Home / Self Care | Payer: BC Managed Care – PPO | Attending: Emergency Medicine | Admitting: Emergency Medicine

## 2022-10-04 ENCOUNTER — Other Ambulatory Visit: Payer: Self-pay

## 2022-10-04 DIAGNOSIS — M5412 Radiculopathy, cervical region: Secondary | ICD-10-CM | POA: Insufficient documentation

## 2022-10-04 DIAGNOSIS — M542 Cervicalgia: Secondary | ICD-10-CM

## 2022-10-04 DIAGNOSIS — H538 Other visual disturbances: Secondary | ICD-10-CM | POA: Insufficient documentation

## 2022-10-04 LAB — CBC WITH DIFFERENTIAL/PLATELET
Abs Immature Granulocytes: 0.01 10*3/uL (ref 0.00–0.07)
Basophils Absolute: 0 10*3/uL (ref 0.0–0.1)
Basophils Relative: 1 %
Eosinophils Absolute: 0.1 10*3/uL (ref 0.0–0.5)
Eosinophils Relative: 1 %
HCT: 34.8 % — ABNORMAL LOW (ref 36.0–46.0)
Hemoglobin: 11.1 g/dL — ABNORMAL LOW (ref 12.0–15.0)
Immature Granulocytes: 0 %
Lymphocytes Relative: 42 %
Lymphs Abs: 2.5 10*3/uL (ref 0.7–4.0)
MCH: 26.7 pg (ref 26.0–34.0)
MCHC: 31.9 g/dL (ref 30.0–36.0)
MCV: 83.9 fL (ref 80.0–100.0)
Monocytes Absolute: 0.3 10*3/uL (ref 0.1–1.0)
Monocytes Relative: 5 %
Neutro Abs: 3 10*3/uL (ref 1.7–7.7)
Neutrophils Relative %: 51 %
Platelets: 197 10*3/uL (ref 150–400)
RBC: 4.15 MIL/uL (ref 3.87–5.11)
RDW: 14.6 % (ref 11.5–15.5)
WBC: 5.9 10*3/uL (ref 4.0–10.5)
nRBC: 0 % (ref 0.0–0.2)

## 2022-10-04 LAB — URINALYSIS, ROUTINE W REFLEX MICROSCOPIC
Bilirubin Urine: NEGATIVE
Glucose, UA: NEGATIVE mg/dL
Ketones, ur: NEGATIVE mg/dL
Leukocytes,Ua: NEGATIVE
Nitrite: NEGATIVE
Protein, ur: NEGATIVE mg/dL
Specific Gravity, Urine: 1.004 — ABNORMAL LOW (ref 1.005–1.030)
pH: 7 (ref 5.0–8.0)

## 2022-10-04 MED ORDER — LACTATED RINGERS IV BOLUS
1000.0000 mL | Freq: Once | INTRAVENOUS | Status: AC
  Administered 2022-10-04: 1000 mL via INTRAVENOUS

## 2022-10-04 MED ORDER — DEXAMETHASONE SODIUM PHOSPHATE 10 MG/ML IJ SOLN
10.0000 mg | Freq: Once | INTRAMUSCULAR | Status: AC
  Administered 2022-10-04: 10 mg via INTRAVENOUS
  Filled 2022-10-04: qty 1

## 2022-10-04 MED ORDER — KETOROLAC TROMETHAMINE 15 MG/ML IJ SOLN
15.0000 mg | Freq: Once | INTRAMUSCULAR | Status: AC
  Administered 2022-10-04: 15 mg via INTRAVENOUS
  Filled 2022-10-04: qty 1

## 2022-10-04 MED ORDER — LORAZEPAM 2 MG/ML IJ SOLN: 1.0000 mg | Freq: Once | INTRAMUSCULAR | Status: DC

## 2022-10-04 MED ORDER — PROCHLORPERAZINE EDISYLATE 10 MG/2ML IJ SOLN
10.0000 mg | Freq: Once | INTRAMUSCULAR | Status: AC
Start: 2022-10-04 — End: 2022-10-04
  Administered 2022-10-04: 10 mg via INTRAVENOUS
  Filled 2022-10-04: qty 2

## 2022-10-04 NOTE — ED Triage Notes (Signed)
Pt arrived POV from home c/o head pressure, neck pain, balance issues, blurred vision in her right eye, and pins and needles in her right arm. Pt states all of this has been going on since she had neck surgery in Sept but has gotten worse.

## 2022-10-04 NOTE — ED Notes (Signed)
Pt currently with MRI.

## 2022-10-04 NOTE — ED Provider Notes (Signed)
[facility] EMERGENCY DEPARTMENT AT [facility] Provider Note   CSN: [id number] Arrival date & time: 10/04/22  1143     History  Chief Complaint  Patient presents with   Blurred Vision   Neck Pain    Joan Pearson is a 57 y.o. female.  HPI  Patient presents with concern for ongoing right head, neck pain and paresthesia in her right arm.  Patient had cervical spine surgery September of last year, Dr. Jules Husbands.  She notes that since that time she has had some discomfort, now worsening over the past few days with new blurry vision on the right side.  No vision loss.  No complete weakness on the right side.  No clear alleviating or exacerbating factors.  She has not spoken with the neurosurgery team recently.     Home Medications Prior to Admission medications   Medication Sig Start Date End Date Taking? Authorizing Provider  amLODipine (NORVASC) 5 MG tablet Take 1 tablet (5 mg total) by mouth daily. 12/23/21   Linwood Dibbles, MD  Multiple Vitamin (MULTIVITAMIN) capsule Take 1 capsule by mouth daily.    [provider]  Oxymetazoline HCl (VICKS SINEX 12 HOUR NA) Place 1 spray into the nose daily as needed (nasal congestion).    [provider]  vitamin B-12 (CYANOCOBALAMIN) 1000 MCG tablet Take 1,000 mcg by mouth daily.    [provider]      Allergies    Atorvastatin    Review of Systems   Review of Systems  All other systems reviewed and are negative.   Physical Exam Updated Vital Signs BP (!) 169/75 (BP Location: Right Arm)   Pulse 65   Temp 97.9 F (36.6 C) (Oral)   Resp 20   Ht 5\' 7"  (1.702 m)   Wt 85.7 kg   SpO2 99%   BMI 29.60 kg/m  Physical Exam Vitals and nursing note reviewed.  Constitutional:      General: She is not in acute distress.    Appearance: She is well-developed.  HENT:     Head: Normocephalic and atraumatic.  Eyes:     Conjunctiva/sclera: Conjunctivae normal.  Cardiovascular:     Rate and Rhythm: Normal rate  and regular rhythm.  Pulmonary:     Effort: Pulmonary effort is normal. No respiratory distress.     Breath sounds: Normal breath sounds. No stridor.  Abdominal:     General: There is no distension.  Skin:    General: Skin is warm and dry.  Neurological:     Mental Status: She is alert and oriented to person, place, and time.     Cranial Nerves: No cranial nerve deficit or dysarthria.     Comments: R<L UE strength. Antalgic gait  Psychiatric:        Mood and Affect: Mood normal.     ED Results / Procedures / Treatments   Labs (all labs ordered are listed, but only abnormal results are displayed) Labs Reviewed  COMPREHENSIVE METABOLIC PANEL  CBC WITH DIFFERENTIAL/PLATELET  URINALYSIS, ROUTINE W REFLEX MICROSCOPIC    EKG None  Radiology No results found.  Procedures Procedures    Medications Ordered in ED Medications  lactated ringers bolus 1,000 mL (1,000 mLs Intravenous New Bag/Given 10/04/22 1515)  dexamethasone (DECADRON) injection 10 mg (10 mg Intravenous Given 10/04/22 1513)  ketorolac (TORADOL) 15 MG/ML injection 15 mg (15 mg Intravenous Given 10/04/22 1513)  prochlorperazine (COMPAZINE) injection 10 mg (10 mg Intravenous Given 10/04/22 1512)  ED Course/ Medical Decision Making/ A&P                             Medical Decision Making Patient with history of cervical spine surgery end of last year now presents with pain, blurry vision, weakness.  Given the duration of symptoms some suspicion for ongoing cervical radiculopathy with inflammation, but given her new blurry vision, additional considerations include intracranial abnormality, electrolyte abnormalities.  Patient had MRIs ordered, labs.   Amount and/or Complexity of Data Reviewed External Data Reviewed: notes.    Details: I reviewed the patient's [facility] note, Dr. Jules Husbands was her surgeon Labs: ordered. Radiology: ordered.  Risk Prescription drug management. Decision regarding  hospitalization.   3:41 PM Patient has been ambulatory to the bathroom several times.  We have discussed initial thoughts, findings.  Patient is awaiting MRI, labs, urinalysis. In essence this adult female with history of prior signs of cervical radiculopathy, now status post surgery last year presents with somewhat similar concerns, right-sided upper extremity weakness, though with new subjective blurry vision.  She is awake, alert, ambulatory, has minor strength deficiency in her right upper extremity, is in no distress.  On signout the patient is awaiting labs, MRI, repeat assessment.  Dr. Rubin Payor is aware.         Final Clinical Impression(s) / ED Diagnoses Final diagnoses:  Neck pain  Blurry vision  Cervical radiculopathy     Gerhard Munch, MD 10/04/22 1541

## 2022-10-04 NOTE — ED Notes (Signed)
Patient transported to MRI 

## 2022-10-12 IMAGING — DX DG SHOULDER 2+V*R*
4 series · 4 of 4 positions shown · non-contrast
Comparison: None.

CLINICAL DATA: Chronic three-month history of RIGHT shoulder pain
which has recently worsened and is now associated with crepitus.

EXAM:
RIGHT SHOULDER - 2+ VIEW

[shoulder grashey]
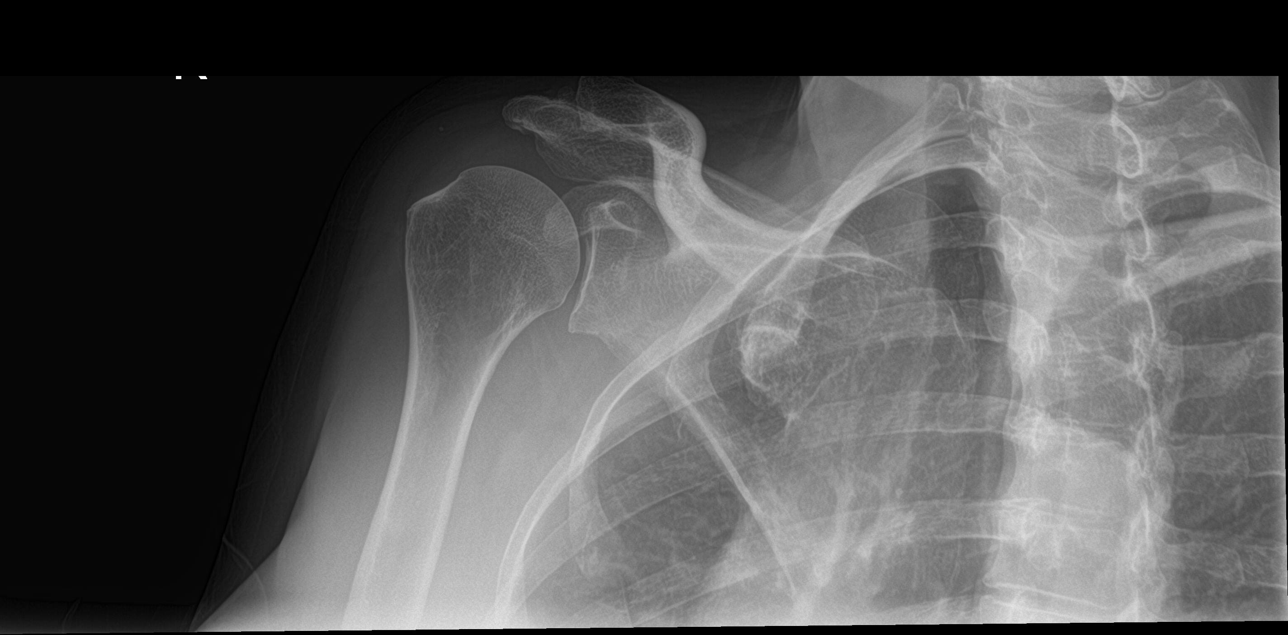

[shoulder y view]
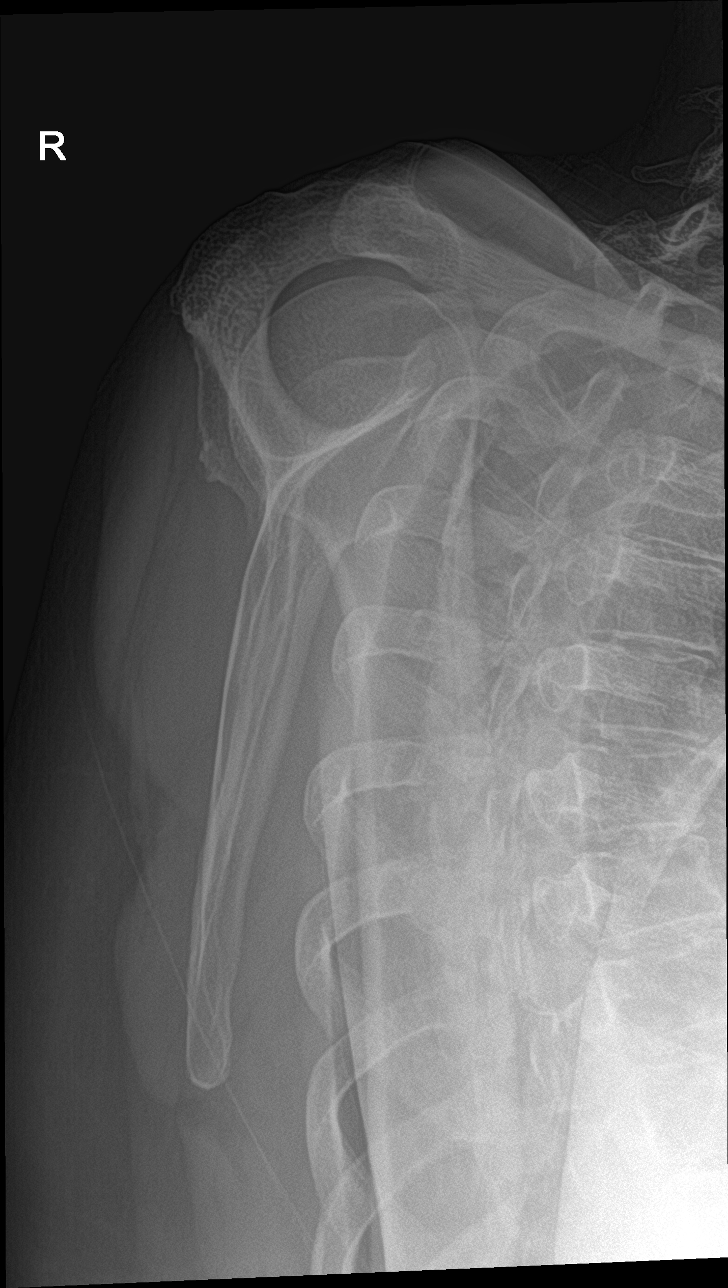

[shoulder axillary]
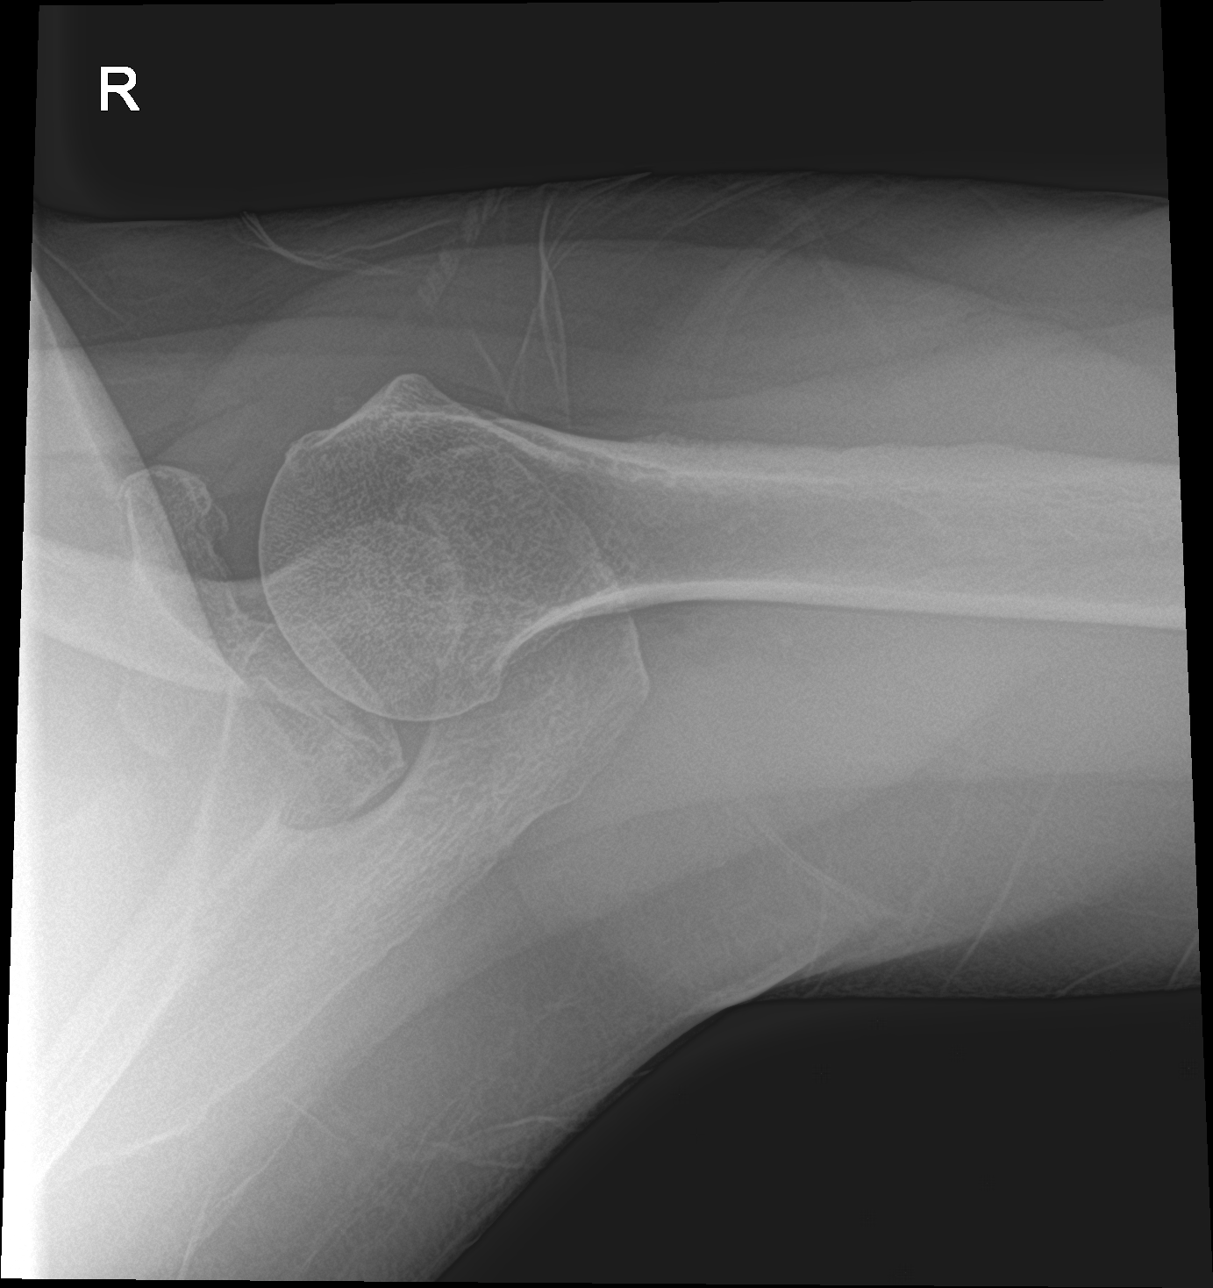

[shoulder ap neutral]
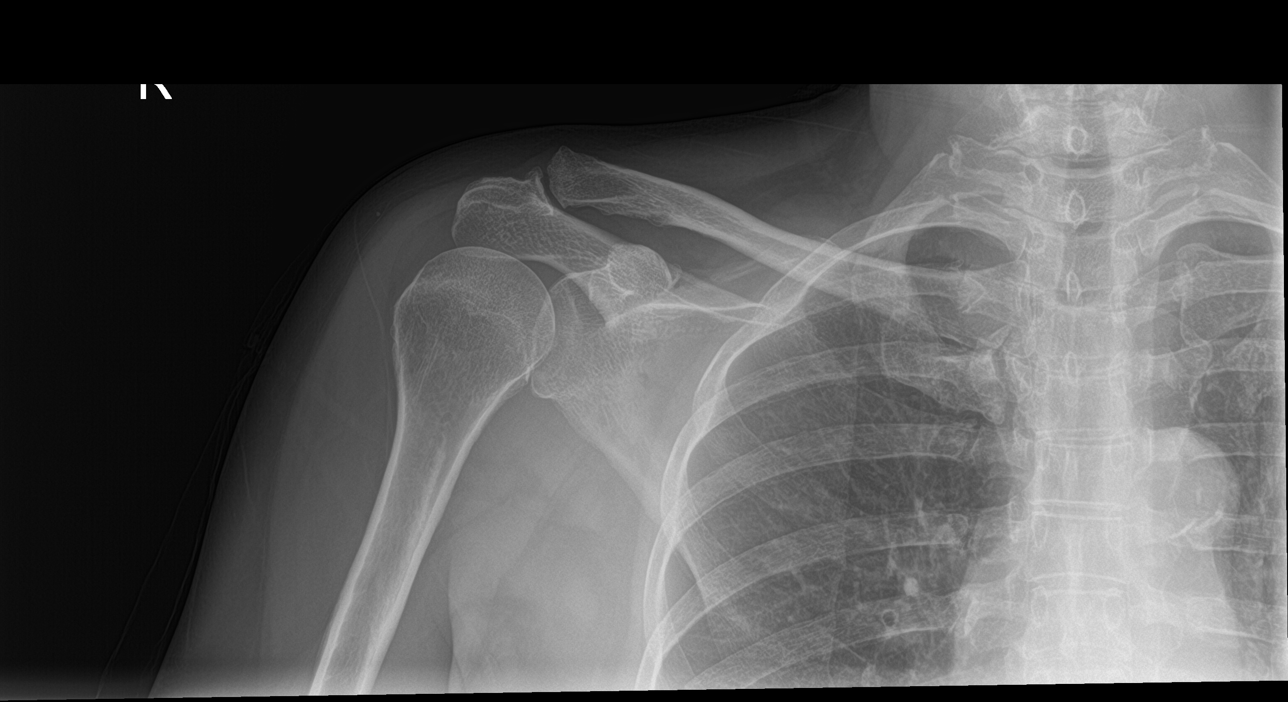

[4 of 4 positions shown; findings below may reference images not displayed]

FINDINGS: No evidence of acute, subacute or healed fracture. Glenohumeral
joint anatomically aligned with well-preserved joint space.
Subacromial space well-preserved. Acromioclavicular joint
anatomically aligned without significant degenerative changes. Well
preserved bone mineral density. No intrinsic osseous abnormality.
IMPRESSION: Normal examination.

## 2022-11-02 LAB — HM MAMMOGRAPHY

## 2022-11-24 ENCOUNTER — Institutional Professional Consult (permissible substitution): Payer: Medicaid Other | Admitting: Medical

## 2022-12-05 ENCOUNTER — Encounter: Payer: Self-pay | Admitting: Medical

## 2022-12-05 ENCOUNTER — Ambulatory Visit (INDEPENDENT_AMBULATORY_CARE_PROVIDER_SITE_OTHER): Payer: Medicaid Other | Admitting: Medical

## 2022-12-05 VITALS — BP 118/80 | HR 49 | Temp 97.5°F | Ht 67.5 in | Wt 193.2 lb

## 2022-12-05 DIAGNOSIS — R531 Weakness: Secondary | ICD-10-CM

## 2022-12-05 DIAGNOSIS — E559 Vitamin D deficiency, unspecified: Secondary | ICD-10-CM

## 2022-12-05 DIAGNOSIS — M62838 Other muscle spasm: Secondary | ICD-10-CM | POA: Diagnosis not present

## 2022-12-05 DIAGNOSIS — R202 Paresthesia of skin: Secondary | ICD-10-CM

## 2022-12-05 DIAGNOSIS — R270 Ataxia, unspecified: Secondary | ICD-10-CM | POA: Diagnosis not present

## 2022-12-05 DIAGNOSIS — R42 Dizziness and giddiness: Secondary | ICD-10-CM

## 2022-12-05 MED ORDER — VITAMIN D (ERGOCALCIFEROL) 1.25 MG (50000 UNIT) PO CAPS
50000.0000 [IU] | ORAL_CAPSULE | ORAL | 2 refills | Status: DC
Start: 2022-12-05 — End: 2023-03-20

## 2022-12-05 MED ORDER — TIZANIDINE HCL 4 MG PO TABS: 4.0000 mg | ORAL_TABLET | Freq: Two times a day (BID) | ORAL | 0 refills | Status: DC | PRN

## 2022-12-05 NOTE — Progress Notes (Signed)
Subjective:  Joan Pearson is a 57 y.o. female who presents for Chief Complaint  Patient presents with   new pt get established    New pt get established, has bump on left side of head, told she might have MS having pin and needles of right arm, having Vertigo over a year. Had neck surgery 03/16/2022 with [facility]. Saw Dr. Vernona Rieger wile for pin and needles of arm, vertigo, off balance and head pressure. Was given prednisone and gabapentin no relief She was going to order MRI of Brain and be referred to neurology but her insurance ended and couldn't get anything done and needed to see a new pcp. Insurance started June 1st.      Here as a new patient today.  Was seeing [facility] prior, then doctors on demand service.  After insurance changed, had to change again.  Before moving to  prior to last year had foot drop develop and diagnosed with peripheral neuropathy.   For at least the last year she notes issues on the left side of her body with aches, pains, numbness and weakness.  She was having a lot of numbness in the left arm but then lately having numbness worse in the right arm.  She feels a pressure sensation in her head when she is moving around such as rolling from one side to another in bed.  She notes prior vertigo issues, feeling off balance at times.  She has tried meclizine for vertigo when this was prescribed in the past but it did not seem to help.  She has also done vestibular physical therapy for vertigo in the past year.  Sometimes gets burning and tingling pain in the extremities.  She has been on gabapentin in the past to help with the symptoms as well as muscle relaxer.  She is out of these medicines.  The gabapentin did not seem to help a lot.  She would like to have some muscle lectures on hand for spasm in the upper back  She has a bump on the left posterior scalp that she just noticed recently.  No warmth, no swelling, or drainage otherwise.    She notes hx/o  stroke 2015.  No other aggravating or relieving factors.    No other c/o.  The following portions of the patient's history were reviewed and updated as appropriate: allergies, current medications, past family history, past medical history, past social history, past surgical history and problem list.  ROS Otherwise as in subjective above    Objective: BP 118/80   Pulse (!) 49   Temp (!) 97.5 F (36.4 C)   Ht 5' 7.5" (1.715 m)   Wt 193 lb 3.2 oz (87.6 kg)   BMI 29.81 kg/m   General appearance: alert, no distress, well developed, well nourished HEENT: normocephalic, sclerae anicteric, conjunctiva pink and moist, TMs pearly, nares patent, no discharge or erythema, pharynx normal Oral cavity: MMM, no lesions Neck: supple, no lymphadenopathy, no thyromegaly, no masses, on bruits Heart: RRR, normal S1, S2, no murmurs Lungs: CTA bilaterally, no wheezes, rhonchi, or rales Pulses: 2+ radial pulses, 2+ pedal pulses, normal cap refill Ext: no edema Neuro: Subtle weakness in the left arm and leg versus the right, a little fluid finger-to-nose and she is little off balance with Romberg and heel-to-toe walk.  Otherwise neuro unremarkable.     Assessment: Encounter Diagnoses  Name Primary?   Left-sided weakness Yes   Paresthesia    Ataxia    Dizziness  Vitamin D deficiency    Muscle spasm      Plan: We discussed her health history, her symptoms and prior evaluation.  She has a history of stroke.  At a prior specialist there was a concern for possible MS.    We discussed that her various arm paresthesias could be related to history of stroke, sequela of stroke, MS or other.  She had neck surgery in the past but says that did not change her symptoms at all.  I reviewed the whole comprehensive panel of blood work she had done back in February pending comprehensive metabolic panel, CBC, thyroid, hemoglobin A1c and lipids.  Her vitamin D was quite low.  Cholesterol could be a little  better.  Otherwise labs are normal  At this point I will refer back to neurology for further evaluation.  Her MRI brain from 12/23/2021 shows no infarction hemorrhage or mass.  There is mild chronic microvascular ischemic changes.  She can use tizanidine for muscle spasm as needed.  Advised stretching regularly.  Consider massage.  Vitamin D deficiency-she completed 1 month of vitamin D supplement a few months ago but her vitamin D was less than 10.  Begin back on supplement and recheck in 67-month  Dellar was seen today for new pt get established.  Diagnoses and all orders for this visit:  Left-sided weakness -     Ambulatory referral to Neurology  Paresthesia -     Ambulatory referral to Neurology  Ataxia  Dizziness  Vitamin D deficiency  Muscle spasm  Other orders -     tiZANidine (ZANAFLEX) 4 MG tablet; Take 1 tablet (4 mg total) by mouth 2 (two) times daily as needed for muscle spasms. -     Vitamin D, Ergocalciferol, (DRISDOL) 1.25 MG (50000 UNIT) CAPS capsule; Take 1 capsule (50,000 Units total) by mouth every 7 (seven) days.   Follow up: 2mo, pending referral to neuro

## 2022-12-13 ENCOUNTER — Telehealth: Payer: Self-pay | Admitting: Medical

## 2022-12-13 NOTE — Telephone Encounter (Signed)
Pt called and is requesting something different or stronger states that the zanaflex is not working, states she is still having muscle spasms really bad, and is needing something Please send to the [id number] - [place], [place] - [address] AT [address]   Informed pt that shane was not in the office this week

## 2022-12-14 NOTE — Progress Notes (Signed)
Chief Complaint  Patient presents with   other    Muscle spasms, all over her body, for 3-6 months, happens more often now and will wake her up, pressure in head for over a year, has dizziness when walking sometimes,     Seen 6/10 by Vincenza Hews as new pt.  She reports h/o stroke (9 years ago, per pt), and neck surgery (02/2022, through [facility] in [place]). Rx'd tizanidine prn muscle spasm. She reports this hasn't helped at all, even at taking 2 at a time. Referred to neuro for eval of L sided numbness, pain, weakness, balance issues. ?poss MS has been mentioned. Scheduled for 8/30, and she is supposedly also on a cancellation list.  Apparently she must have brought in labs, that are not scanned into chart.  Shane's notes reports labs from 07/2022 (c-met, cbc, TSH, A1c, lipids, and a low vitamin D).  He started her on rx D. He commented "cholesterol could be a little better". She isn't on a statin, and has h/o CVA.  She reports she was put on lipitor, recalls having side effects. Recalls being told by Mikael Spray that "she didn't need it".  Current complaint: "My whole body spasms"--back, chest, both arms, buttocks, thighs, calves. Described as "mini-contractions", full cramp just in the thighs. Currently spasms are moving around her body (back/thigh/calves) Worst in back and legs. While walking to the bathroom yesterday, her legs gave out and she fell.  No injury. That was the first time her legs have given out.  She is mainly complaining of issues in the right arm.  She used to have problems in the LUE.  She reported not noticing any difference in her symptoms since her neck surgery--she stated the only way she can tell she had surgery is due to the limited ROM when she tilts her head back. She is currently reporting stiffness and pins and needles from the fingertips in her R hand to her R elbow.  She no longer has any left upper extremity probleCurrently is having from fingertips to her  elbow is stiff, pins/needles. She has pressure at the back of the right scalp/neck.  This is very worrisome to her.  Last saw neurosurgeons in May. Did x-ray, was told that the hardware was fine.  Per chart, MRI of c-spine and brain were ordered in the spring by her former pcp (doctor on demand)--never got authorized. She did a course of prednisone in March for same symptoms, as well as Robaxin.  Didn't help. She states the spasms are about the same, but the new part is discomfort/pressure in R posterior neck.  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  MRI brain 12/2021: IMPRESSION: No acute infarction, hemorrhage, or mass. Mild chronic microvascular ischemic changes  She recalls having dropfoot in 2019-2020.  Had EMG/NCG through [facility], diagnosed with neuropathy.  She was treated with gabapentin, but it didn't help.  She wasn't having significant pain at that time. Didn't have any side effects/sedation that she can recall. She took it for 30d   PMH, PSH, SH reviewed  Outpatient Encounter Medications as of 12/15/2022  Medication Sig   amLODipine (NORVASC) 5 MG tablet Take 1 tablet (5 mg total) by mouth daily.   Multiple Vitamin (MULTIVITAMIN) capsule Take 1 capsule by mouth daily.   tiZANidine (ZANAFLEX) 4 MG tablet Take 1 tablet (4 mg total) by mouth 2 (two) times daily as needed for muscle spasms.  vitamin B-12 (CYANOCOBALAMIN) 1000 MCG tablet Take 1,000 mcg by mouth daily.   Vitamin D, Ergocalciferol, (DRISDOL) 1.25 MG (50000 UNIT) CAPS capsule Take 1 capsule (50,000 Units total) by mouth every 7 (seven) days.   No facility-administered encounter medications on file as of 12/15/2022.   Allergies  Allergen Reactions   Atorvastatin Other (See Comments)    muscle cramps, bone pain   ROS: no fever, occ chills. No URI symptoms. Occ decreased vision in R eye No congestion, cough, shortness of  breath, chest pain, palpitations. No nausea, vomiting, abdominal pain. Occ constipation. No urinary complaints. No bleeding/bruising or rashes. See HPI for her many complaints today.    PHYSICAL EXAM:  BP 128/82   Pulse 71   Wt 192 lb 3.2 oz (87.2 kg)   BMI 29.66 kg/m   Wt Readings from Last 3 Encounters:  12/15/22 192 lb 3.2 oz (87.2 kg)  12/05/22 193 lb 3.2 oz (87.6 kg)  10/04/22 189 lb (85.7 kg)   Well-appearing, pleasant female, who doesn't appear to be in any distress today HEENT: conjunctiva and sclera are clear, EOMI. Neck: no c-spine tenderness. She has just slight tenderness across the trapezius muscles. No spasm noted, no trigger points. No lymphadenopathy, thyromegaly or mass Back: no spinal or CVA tenderness, no palpable muscle spasm. Slight diffuse tenderness, not focal Heart: regular rate and rhythm Lungs: clear bilaterally Abdomen: soft, nontender Extremities: no edema. No visible cramp/spasm/twitching Neuro: alert and oriented, cranial nerves grossly intact.  Symmetric DTR's, normal gait    ASSESSMENT/PLAN:   1. Muscle spasm Unclear if truly having spasm (poss R neck), hasn't had improvement from 2 different muscle relaxants. - CBC with Differential/Platelet - Comprehensive metabolic panel - predniSONE (STERAPRED UNI-PAK 21 TAB) 10 MG (21) TBPK tablet; Take as directed, with food.  Dispense: 21 each; Refill: 0  2. Paresthesia In RUE, poss cervical radiculopathy, vs other etiology.  Trial of prednisone - CBC with Differential/Platelet - Comprehensive metabolic panel - Sedimentation Rate - C-reactive protein  3. Chronic intractable headache, unspecified headache type R posterior HA, fairly new onset.  Has had normal brain MRI.  Trial prednisone.  Will also re-try gabapentin for her various pains and neuropathy, until she can get in with neuro.  Can also consider trial of duloxetine. - CBC with Differential/Platelet - Comprehensive metabolic panel -  Sedimentation Rate - C-reactive protein - predniSONE (STERAPRED UNI-PAK 21 TAB) 10 MG (21) TBPK tablet; Take as directed, with food.  Dispense: 21 each; Refill: 0 - gabapentin (NEURONTIN) 300 MG capsule; Start at 1 capsule at bedtime, increase by 1 capsule weekly up to 1 capsules three times daily  Dispense: 90 capsule; Refill: 1  4. Myalgia Unclear etiology.  Check CK, inflammatory markers. Consider poss fibromyalgia diagnosis if normal.  Treating with prednisone, gabapentin. Consider duloxetine in future. - CK - predniSONE (STERAPRED UNI-PAK 21 TAB) 10 MG (21) TBPK tablet; Take as directed, with food.  Dispense: 21 each; Refill: 0  5. Neuropathy Previously diagnosed at [facility]. Encouraged her to get records of testing to provide to neurologist.  Re-try gabapentin (start at 300 mg at bedtime, titrate up to TD); consider Duloxetine - gabapentin (NEURONTIN) 300 MG capsule; Start at 1 capsule at bedtime, increase by 1 capsule weekly up to 1 capsules three times daily  Dispense: 90 capsule; Refill: 1  Cbc, CK, c-met, ESR, CRP  She is schedule to see neuro, and on cancellation list. To take the prednisone course, gabapentin. Discussed trial of coenzyme Q10  Discussed  her prior h/o stroke, and that she should be on a statin with that history. She can further discuss this with her PCP or neuro.  I spent 40 minutes dedicated to the care of this patient, including pre-visit review of records, face to face time, post-visit ordering of testing and documentation.   You can consider trying Coenzyme Q10 daily (over-the-counter supplement).  If your current body pains are at all similar to what you had when you took lipitor, it might help.  Take the prednisone as directed. Hopefully this will help some of the pain and tingling. If you don't get significant benefit, or are having ongoing problems with the spasms and pain, then start the gabapentin. Start with gabapentin 300 mg at bedtime. Do this for a  week, then you can increase weekly by 300mg  (the 2nd week take it twice daily, and the next week take it 3 times daily). Be careful as this may cause sedation.

## 2022-12-15 ENCOUNTER — Ambulatory Visit (INDEPENDENT_AMBULATORY_CARE_PROVIDER_SITE_OTHER): Payer: Medicaid Other | Admitting: Family Medicine

## 2022-12-15 ENCOUNTER — Encounter: Payer: Self-pay | Admitting: Family Medicine

## 2022-12-15 VITALS — BP 128/82 | HR 71 | Wt 192.2 lb

## 2022-12-15 DIAGNOSIS — G629 Polyneuropathy, unspecified: Secondary | ICD-10-CM

## 2022-12-15 DIAGNOSIS — M62838 Other muscle spasm: Secondary | ICD-10-CM | POA: Diagnosis not present

## 2022-12-15 DIAGNOSIS — M791 Myalgia, unspecified site: Secondary | ICD-10-CM | POA: Diagnosis not present

## 2022-12-15 DIAGNOSIS — R519 Headache, unspecified: Secondary | ICD-10-CM

## 2022-12-15 DIAGNOSIS — G8929 Other chronic pain: Secondary | ICD-10-CM

## 2022-12-15 DIAGNOSIS — R202 Paresthesia of skin: Secondary | ICD-10-CM

## 2022-12-15 MED ORDER — PREDNISONE 10 MG (21) PO TBPK: ORAL_TABLET | ORAL | 0 refills | Status: DC

## 2022-12-15 MED ORDER — GABAPENTIN 300 MG PO CAPS: ORAL_CAPSULE | ORAL | 1 refills | Status: DC

## 2022-12-15 NOTE — Patient Instructions (Signed)
Take the prednisone as directed. Hopefully this will help some of the pain and tingling. If you don't get significant benefit, or are having ongoing problems with the spasms and pain, then start the gabapentin. Start with gabapentin 300 mg at bedtime. Do this for a week, then you can increase weekly by 300mg  (the 2nd week take it twice daily, and the next week take it 3 times daily). Be careful as this may cause sedation.

## 2022-12-16 LAB — CBC WITH DIFFERENTIAL/PLATELET
Basophils Absolute: 0 10*3/uL (ref 0.0–0.2)
Basos: 1 %
EOS (ABSOLUTE): 0.1 10*3/uL (ref 0.0–0.4)
Eos: 1 %
Hematocrit: 37.5 % (ref 34.0–46.6)
Hemoglobin: 12.2 g/dL (ref 11.1–15.9)
Immature Grans (Abs): 0 10*3/uL (ref 0.0–0.1)
Immature Granulocytes: 0 %
Lymphocytes Absolute: 2.6 10*3/uL (ref 0.7–3.1)
Lymphs: 33 %
MCH: 26.8 pg (ref 26.6–33.0)
MCHC: 32.5 g/dL (ref 31.5–35.7)
MCV: 82 fL (ref 79–97)
Monocytes Absolute: 0.7 10*3/uL (ref 0.1–0.9)
Monocytes: 8 %
Neutrophils Absolute: 4.5 10*3/uL (ref 1.4–7.0)
Neutrophils: 57 %
Platelets: 229 10*3/uL (ref 150–450)
RBC: 4.55 x10E6/uL (ref 3.77–5.28)
RDW: 13 % (ref 11.7–15.4)
WBC: 7.9 10*3/uL (ref 3.4–10.8)

## 2022-12-16 LAB — COMPREHENSIVE METABOLIC PANEL
ALT: 13 IU/L (ref 0–32)
AST: 16 IU/L (ref 0–40)
Albumin: 4.5 g/dL (ref 3.8–4.9)
Alkaline Phosphatase: 83 IU/L (ref 44–121)
BUN/Creatinine Ratio: 17 (ref 9–23)
BUN: 14 mg/dL (ref 6–24)
Bilirubin Total: 0.5 mg/dL (ref 0.0–1.2)
CO2: 25 mmol/L (ref 20–29)
Calcium: 10 mg/dL (ref 8.7–10.2)
Chloride: 106 mmol/L (ref 96–106)
Creatinine, Ser: 0.83 mg/dL (ref 0.57–1.00)
Globulin, Total: 2.8 g/dL (ref 1.5–4.5)
Glucose: 92 mg/dL (ref 70–99)
Potassium: 4.6 mmol/L (ref 3.5–5.2)
Sodium: 144 mmol/L (ref 134–144)
Total Protein: 7.3 g/dL (ref 6.0–8.5)
eGFR: 83 mL/min/{1.73_m2} (ref >59)

## 2022-12-16 LAB — C-REACTIVE PROTEIN: CRP: <1 mg/L (ref 0–10)

## 2022-12-16 LAB — CK: Total CK: 134 U/L (ref 32–182)

## 2022-12-16 LAB — SEDIMENTATION RATE: Sed Rate: 7 mm/hr (ref 0–40)

## 2022-12-17 ENCOUNTER — Other Ambulatory Visit: Payer: Self-pay | Admitting: Medical

## 2022-12-31 ENCOUNTER — Other Ambulatory Visit: Payer: Self-pay | Admitting: Medical

## 2023-02-23 ENCOUNTER — Encounter: Payer: Self-pay | Admitting: Diagnostic Neuroimaging

## 2023-02-23 ENCOUNTER — Ambulatory Visit: Payer: Medicaid Other | Admitting: Diagnostic Neuroimaging

## 2023-02-23 ENCOUNTER — Institutional Professional Consult (permissible substitution): Payer: Medicaid Other | Admitting: Diagnostic Neuroimaging

## 2023-02-23 VITALS — BP 172/82 | HR 48 | Ht 67.0 in | Wt 185.0 lb

## 2023-02-23 DIAGNOSIS — M5481 Occipital neuralgia: Secondary | ICD-10-CM | POA: Diagnosis not present

## 2023-02-23 DIAGNOSIS — M62838 Other muscle spasm: Secondary | ICD-10-CM

## 2023-02-23 DIAGNOSIS — R200 Anesthesia of skin: Secondary | ICD-10-CM | POA: Diagnosis not present
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
## 2023-02-24 ENCOUNTER — Institutional Professional Consult (permissible substitution): Payer: Medicaid Other | Admitting: Diagnostic Neuroimaging

## 2023-02-24 LAB — TSH RFX ON ABNORMAL TO FREE T4: TSH: 1.2 u[IU]/mL (ref 0.450–4.500)

## 2023-02-28 ENCOUNTER — Telehealth: Payer: Self-pay | Admitting: Diagnostic Neuroimaging

## 2023-02-28 NOTE — Telephone Encounter (Signed)
MRI brain: [facility] medicaid Berkley Harvey: [id number] exp. 02/28/23-04/14/23  MRI cervical: [employer] medicaid Berkley Harvey: [id number] exp. 02/28/23-04/14/23  MRI thoracic: [employer] medicaid Berkley Harvey: [id number] exp. 02/28/23-04/14/23 sent to GI [phone]

## 2023-03-14 LAB — ACHR ABS WITH REFLEX TO MUSK: AChR Binding Ab, Serum: 0.08 nmol/L (ref 0.00–0.24)

## 2023-03-14 LAB — VITAMIN B12: Vitamin B-12: 799 pg/mL (ref 232–1245)

## 2023-03-14 LAB — MUSK ANTIBODIES: MuSK Antibodies: <1 U/mL

## 2023-03-15 ENCOUNTER — Telehealth: Payer: Self-pay

## 2023-03-15 NOTE — Telephone Encounter (Signed)
-----   Message from Suanne Marker sent at 03/15/2023  4:29 PM EDT ----- Normal labs. Please call patient. -VRP

## 2023-03-15 NOTE — Telephone Encounter (Signed)
Called patient and informed her per Dr. Marjory Lies "Normal labs. Please call patient." Pt verbalized understanding. Pt had no questions at this time but was encouraged to call back if questions arise.

## 2023-03-16 ENCOUNTER — Encounter: Payer: Medicaid Other | Admitting: Diagnostic Neuroimaging

## 2023-03-20 ENCOUNTER — Ambulatory Visit (INDEPENDENT_AMBULATORY_CARE_PROVIDER_SITE_OTHER): Payer: Medicaid Other | Admitting: Medical

## 2023-03-20 VITALS — BP 132/72 | HR 58 | Temp 97.6°F | Wt 187.4 lb

## 2023-03-20 DIAGNOSIS — M2550 Pain in unspecified joint: Secondary | ICD-10-CM | POA: Diagnosis not present

## 2023-03-20 DIAGNOSIS — E559 Vitamin D deficiency, unspecified: Secondary | ICD-10-CM | POA: Diagnosis not present

## 2023-03-20 DIAGNOSIS — Z1231 Encounter for screening mammogram for malignant neoplasm of breast: Secondary | ICD-10-CM | POA: Diagnosis not present

## 2023-03-20 DIAGNOSIS — Z1211 Encounter for screening for malignant neoplasm of colon: Secondary | ICD-10-CM | POA: Insufficient documentation

## 2023-03-20 DIAGNOSIS — I10 Essential (primary) hypertension: Secondary | ICD-10-CM | POA: Insufficient documentation

## 2023-03-20 DIAGNOSIS — M791 Myalgia, unspecified site: Secondary | ICD-10-CM | POA: Insufficient documentation

## 2023-03-20 DIAGNOSIS — Z90710 Acquired absence of both cervix and uterus: Secondary | ICD-10-CM | POA: Diagnosis not present

## 2023-03-20 DIAGNOSIS — M62838 Other muscle spasm: Secondary | ICD-10-CM | POA: Diagnosis not present

## 2023-03-20 DIAGNOSIS — G4700 Insomnia, unspecified: Secondary | ICD-10-CM | POA: Diagnosis not present

## 2023-03-20 DIAGNOSIS — G8929 Other chronic pain: Secondary | ICD-10-CM | POA: Insufficient documentation

## 2023-03-20 MED ORDER — PREGABALIN 25 MG PO CAPS: 25.0000 mg | ORAL_CAPSULE | Freq: Two times a day (BID) | ORAL | 0 refills | Status: DC

## 2023-03-20 MED ORDER — VITAMIN D (ERGOCALCIFEROL) 1.25 MG (50000 UNIT) PO CAPS: 50000.0000 [IU] | ORAL_CAPSULE | ORAL | 1 refills | Status: DC

## 2023-03-20 MED ORDER — AMLODIPINE BESYLATE 5 MG PO TABS: 5.0000 mg | ORAL_TABLET | Freq: Every day | ORAL | 1 refills | Status: DC

## 2023-03-20 MED ORDER — DIAZEPAM 5 MG PO TABS
5.0000 mg | ORAL_TABLET | Freq: Every evening | ORAL | 0 refills | Status: DC | PRN
Start: 2023-03-20 — End: 2023-04-26

## 2023-03-20 NOTE — Patient Instructions (Signed)
Recommendations:  Insomnia Stop trazodone Begin trial of Valium in the evening to help with muscle spasm and sleep.  This can make you sleepy  Muscle spasm Begin trial of Valium in the evening to help with muscle spasm and sleep.  This can make you sleepy  Chronic pain Begin trial of Lyrica 1 tablet twice daily low dose.   Follow-up pending labs  Continue plan for your MRI through neurology  I want you to work on some diet changes. Cut out processed food, junk food, candy and high sugar foods I want you to eat 2 or 3 different fruits every day.  Try some fruits like berries, such as blueberries, blackberries, raspberries, strawberries, apples, bananas, etc.  Try a variety of fruits Eat vegetables every day, the more color the better I would like you to possibly cut out any white bread or white Posta or white grains for the next month.  Just eat whole-grain such as whole-grain bread, brown rice, quinoa, barley or other full grains Try to limit your meat servings to 3 to 4 ounces or less and avoid pork and beef for the next month Drink mostly water  Reduce stress where possible   Follow-up here in 1 month
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
## 2023-03-21 LAB — ANA COMPREHENSIVE PANEL
Anti JO-1: <0.2 AI (ref 0.0–0.9)
Centromere Ab Screen: <0.2 AI (ref 0.0–0.9)
Chromatin Ab SerPl-aCnc: <0.2 AI (ref 0.0–0.9)
ENA RNP Ab: 0.2 AI (ref 0.0–0.9)
ENA SM Ab Ser-aCnc: <0.2 AI (ref 0.0–0.9)
ENA SSA (RO) Ab: <0.2 AI (ref 0.0–0.9)
ENA SSB (LA) Ab: <0.2 AI (ref 0.0–0.9)
Scleroderma (Scl-70) (ENA) Antibody, IgG: <0.2 AI (ref 0.0–0.9)
dsDNA Ab: <1 IU/mL (ref 0–9)

## 2023-03-21 LAB — CYCLIC CITRUL PEPTIDE ANTIBODY, IGG/IGA: Cyclic Citrullin Peptide Ab: 175 units — ABNORMAL HIGH (ref 0–19)

## 2023-03-21 LAB — SEDIMENTATION RATE: Sed Rate: 6 mm/hr (ref 0–40)

## 2023-03-22 ENCOUNTER — Other Ambulatory Visit: Payer: Self-pay | Admitting: Medical

## 2023-03-22 DIAGNOSIS — G8929 Other chronic pain: Secondary | ICD-10-CM

## 2023-03-22 MED ORDER — MELOXICAM 15 MG PO TABS
15.0000 mg | ORAL_TABLET | Freq: Every day | ORAL | 0 refills | Status: AC
Start: 2023-03-22 — End: 2024-03-21

## 2023-03-22 NOTE — Progress Notes (Signed)
Your sed rate marker was normal the ANA lupus screening panel was normal however interestingly your rheumatoid arthritis antibody blood test was abnormal.  In addition to the recommendations we discussed, begin trial of meloxicam anti-inflammatory  I am going to refer you to rheumatology for further evaluation  Joan Pearson-make sure she is on the schedule for a physical in the near future as we need to go over her routine care including update in the metric gaps

## 2023-03-23 ENCOUNTER — Telehealth: Payer: Self-pay | Admitting: Medical

## 2023-03-23 NOTE — Telephone Encounter (Signed)
Rheumatology referral pt was given that office is booked until March She contacted [facility] and they have openings earlier so she wants referral switched to [facility] on [address]   Please notify pt

## 2023-03-28 ENCOUNTER — Telehealth: Payer: Self-pay | Admitting: Internal Medicine

## 2023-03-28 ENCOUNTER — Other Ambulatory Visit: Payer: Self-pay | Admitting: Medical

## 2023-03-28 MED ORDER — DICLOFENAC SODIUM 75 MG PO TBEC: 75.0000 mg | DELAYED_RELEASE_TABLET | Freq: Two times a day (BID) | ORAL | 0 refills | Status: DC

## 2023-03-28 MED ORDER — PREGABALIN 50 MG PO CAPS
50.0000 mg | ORAL_CAPSULE | Freq: Two times a day (BID) | ORAL | 1 refills | Status: DC
Start: 2023-03-28 — End: 2023-04-14

## 2023-03-28 NOTE — Telephone Encounter (Signed)
Pt called and states that meloxicam nor Diazepam is helping. She said she tried doubling up on the Diazepam and it didn't touch her pain either. She is having pain in her feet, hands, back and all over. Her appt isn't until March with rheumatology. She needs something else for her pain in the meantime. She said this weather with rain hasn't helped either

## 2023-03-28 NOTE — Telephone Encounter (Signed)
Pt was notified of results

## 2023-03-28 NOTE — Telephone Encounter (Signed)
Joan Pearson- can you send something in for sleep. She is having trouble sleeping    Joan Pearson- can you try to get her in sooner for rheumatology with dr. Al Pimple.

## 2023-03-29 ENCOUNTER — Telehealth: Payer: Self-pay | Admitting: Medical

## 2023-03-29 NOTE — Telephone Encounter (Signed)
error 

## 2023-04-03 NOTE — Telephone Encounter (Signed)
Left message for pt to call me back 

## 2023-04-04 NOTE — Telephone Encounter (Signed)
Pt coming in tomorrow

## 2023-04-05 ENCOUNTER — Ambulatory Visit (INDEPENDENT_AMBULATORY_CARE_PROVIDER_SITE_OTHER): Payer: Medicaid Other | Admitting: Medical

## 2023-04-05 VITALS — BP 130/80 | HR 55 | Wt 188.8 lb

## 2023-04-05 DIAGNOSIS — M2560 Stiffness of unspecified joint, not elsewhere classified: Secondary | ICD-10-CM | POA: Insufficient documentation

## 2023-04-05 DIAGNOSIS — Z129 Encounter for screening for malignant neoplasm, site unspecified: Secondary | ICD-10-CM | POA: Diagnosis not present

## 2023-04-05 DIAGNOSIS — G8929 Other chronic pain: Secondary | ICD-10-CM

## 2023-04-05 DIAGNOSIS — Z7185 Encounter for immunization safety counseling: Secondary | ICD-10-CM | POA: Diagnosis not present

## 2023-04-05 DIAGNOSIS — I10 Essential (primary) hypertension: Secondary | ICD-10-CM

## 2023-04-05 DIAGNOSIS — M791 Myalgia, unspecified site: Secondary | ICD-10-CM

## 2023-04-05 DIAGNOSIS — G4700 Insomnia, unspecified: Secondary | ICD-10-CM | POA: Diagnosis not present

## 2023-04-05 DIAGNOSIS — M62838 Other muscle spasm: Secondary | ICD-10-CM

## 2023-04-05 DIAGNOSIS — R768 Other specified abnormal immunological findings in serum: Secondary | ICD-10-CM | POA: Insufficient documentation

## 2023-04-05 DIAGNOSIS — M2550 Pain in unspecified joint: Secondary | ICD-10-CM | POA: Diagnosis not present

## 2023-04-05 MED ORDER — BELSOMRA 10 MG PO TABS
10.0000 mg | ORAL_TABLET | Freq: Every evening | ORAL | Status: DC | PRN
Start: 2023-04-05 — End: 2023-04-26

## 2023-04-05 NOTE — Patient Instructions (Signed)
We discussed her recent labs and prior complaints of polyarthralgia, muscle spasm, myalgia, chronic pain.  She does endorse morning stiffness and joint swelling and symmetrical joint pain such as knees, lower legs, feet and hands  Her CCP antibody is recent positive.  We had referred to rheumatology but the soonest we can get her in is 6+ months away.  We discussed possibly using a trial of Plaquenil.  I advised I was not comfortable with any of the other DMARD drugs without rheumatology input such as methotrexate.  She wants to hold off on Plaquenil for now  Regarding pain, continue Lyrica 50 mg twice daily and if not improving in the next few weeks we can go up to the next higher dose For pain flareup and swelling of joints you can use diclofenac or meloxicam or other anti-inflammatory such as Aleve as needed for worse pain and flare but do not take this every single day Tylenol is another option for pain in general that you can do fairly regularly if needed once or twice daily You can use topical remedies such as diclofenac topical gel, Aspercreme, capsaicin cream, CBD topical oil, lavender oral Continue Valium as needed once or twice a day for muscle spasm  Follow-up as planned for MRI this weekend and follow-up with neurology  Of note you have failed prior medications including gabapentin  Hypertension-continue current medication  Vitamin D deficiency -continue current medication  Insomnia-begin trial of Belsomra for sleep aid at night.  There are higher doses if this dose does not seem to work.  You have failed trazodone prior and muscle laxer do not seem to make you sleepy  We will try to request her mammogram from 2024  We will try to request her last colon cancer screening from within the past year  Of note , status post hysterectomy
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
## 2023-04-07 ENCOUNTER — Encounter: Payer: Self-pay | Admitting: Internal Medicine

## 2023-04-08 ENCOUNTER — Ambulatory Visit
Admission: RE | Admit: 2023-04-08 | Discharge: 2023-04-08 | Disposition: A | Discharge status: Home / Self Care | Payer: Medicaid Other | Source: Ambulatory Visit | Attending: Diagnostic Neuroimaging | Admitting: Diagnostic Neuroimaging

## 2023-04-08 DIAGNOSIS — M62838 Other muscle spasm: Secondary | ICD-10-CM | POA: Diagnosis not present

## 2023-04-08 DIAGNOSIS — M5481 Occipital neuralgia: Secondary | ICD-10-CM | POA: Diagnosis not present

## 2023-04-08 DIAGNOSIS — R200 Anesthesia of skin: Secondary | ICD-10-CM

## 2023-04-08 MED ORDER — GADOPICLENOL 0.5 MMOL/ML IV SOLN
9.0000 mL | Freq: Once | INTRAVENOUS | Status: AC | PRN
  Administered 2023-04-08: 9 mL via INTRAVENOUS

## 2023-04-11 ENCOUNTER — Other Ambulatory Visit: Payer: Self-pay | Admitting: Medical

## 2023-04-11 ENCOUNTER — Ambulatory Visit
Admission: RE | Admit: 2023-04-11 | Discharge: 2023-04-11 | Disposition: A | Discharge status: Home / Self Care | Payer: Medicaid Other | Source: Ambulatory Visit | Attending: Diagnostic Neuroimaging | Admitting: Diagnostic Neuroimaging

## 2023-04-11 DIAGNOSIS — M5481 Occipital neuralgia: Secondary | ICD-10-CM

## 2023-04-11 DIAGNOSIS — M62838 Other muscle spasm: Secondary | ICD-10-CM

## 2023-04-11 DIAGNOSIS — R200 Anesthesia of skin: Secondary | ICD-10-CM | POA: Diagnosis not present

## 2023-04-11 MED ORDER — GADOPICLENOL 0.5 MMOL/ML IV SOLN
8.5000 mL | Freq: Once | INTRAVENOUS | Status: AC | PRN
  Administered 2023-04-11: 8.5 mL via INTRAVENOUS

## 2023-04-11 MED ORDER — DICLOFENAC SODIUM 75 MG PO TBEC: 75.0000 mg | DELAYED_RELEASE_TABLET | Freq: Two times a day (BID) | ORAL | 0 refills | Status: DC

## 2023-04-12 ENCOUNTER — Telehealth: Payer: Self-pay | Admitting: Medical

## 2023-04-12 NOTE — Telephone Encounter (Signed)
Pt needs P.A. for Diclofenac & would like Belsomra said that took 30mg  to work for her & helped her to sleep for 6 hours, she will need a P.A. on that also so if you can give her samples would be great

## 2023-04-13 ENCOUNTER — Encounter: Payer: Self-pay | Admitting: Diagnostic Neuroimaging

## 2023-04-13 ENCOUNTER — Telehealth: Payer: Self-pay | Admitting: Medical

## 2023-04-13 ENCOUNTER — Telehealth: Payer: Self-pay

## 2023-04-13 ENCOUNTER — Ambulatory Visit (INDEPENDENT_AMBULATORY_CARE_PROVIDER_SITE_OTHER): Payer: Medicaid Other | Admitting: Diagnostic Neuroimaging

## 2023-04-13 DIAGNOSIS — Z0289 Encounter for other administrative examinations: Secondary | ICD-10-CM

## 2023-04-13 DIAGNOSIS — M62838 Other muscle spasm: Secondary | ICD-10-CM

## 2023-04-13 DIAGNOSIS — R200 Anesthesia of skin: Secondary | ICD-10-CM

## 2023-04-13 DIAGNOSIS — M5481 Occipital neuralgia: Secondary | ICD-10-CM

## 2023-04-13 NOTE — Telephone Encounter (Signed)
Pt said pain medication still not at pharmacy, (Norco)  Pt was already informed it was sent in. Shane I can't see where it was sent in, can you resend, pt says she is in terrible pain.

## 2023-04-13 NOTE — Procedures (Signed)
[facility]  NCS (NERVE CONDUCTION STUDY) WITH EMG (ELECTROMYOGRAPHY) REPORT   STUDY DATE: 04/13/23 PATIENT NAME: Joan Pearson DOB: 1965/08/05 MRN: [id number]  ORDERING CLINICIAN: Joycelyn Schmid, MD   TECHNOLOGIST: Jenelle Mages ELECTROMYOGRAPHER: Glenford Bayley. Shylo Dillenbeck, MD  CLINICAL INFORMATION: 57 year old female with numbness and pain.  FINDINGS: NERVE CONDUCTION STUDY:  Bilateral ulnar and left median motor responses are normal.  Right median motor response is prolonged distal latency, normal amplitude, normal conduction velocity.  Bilateral ulnar F-wave latencies are normal.  Bilateral ulnar sensory responses are normal.  Bilateral median sensory responses have prolonged peak latencies.  Bilateral median to ulnar transcarpal comparison studies show prolonged peak latency differences (right 1.5 ms, left 0.6 ms, normal less than or equal to 0.4 ms).   NEEDLE ELECTROMYOGRAPHY:  Needle examination of right upper extremity is normal.   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    INTERPRETING PHYSICIAN:  Suanne Marker, MD Certified in Neurology, Neurophysiology and Neuroimaging  [facility] [address], [address] [place], [place] [address] [phone]  [facility]    Nerve / Sites Muscle Latency Ref. Amplitude Ref. Rel Amp Segments Distance Velocity Ref. Area    ms ms mV mV %  cm m/s m/s mVms  R Median - APB     Wrist APB 5.0 <=4.4 6.4 >=4.0 100 Wrist - APB 7   23.9     Upper arm APB 9.1  5.8  91.3 Upper arm - Wrist 24 58 >=49 25.1  L Median - APB     Wrist APB 4.2 <=4.4 5.1 >=4.0 100 Wrist - APB 7   18.8     Upper arm APB 8.2  6.5  126 Upper arm - Wrist 27 66 >=49 20.8  R Ulnar - ADM     Wrist ADM 3.0 <=3.3 8.8 >=6.0 100 Wrist - ADM 7   29.7     B.Elbow ADM 4.8  8.8  99.6 B.Elbow - Wrist  13 73 >=49 29.6     A.Elbow ADM 6.8  8.7  99.5 A.Elbow - B.Elbow 17 83 >=49 28.4  L Ulnar - ADM     Wrist ADM 2.7 <=3.3 6.0 >=6.0 100 Wrist - ADM 7   21.1     B.Elbow ADM 4.6  6.2  103 B.Elbow - Wrist 12 63 >=49 21.9     A.Elbow ADM 7.5  5.7  91.8 A.Elbow - B.Elbow 19 65 >=49 22.1             SNC    Nerve / Sites Rec. Site Peak Lat Ref.  Amp Ref. Segments Distance Peak Diff Ref.    ms ms V V  cm ms ms  R Median, Ulnar - Transcarpal comparison     Median Palm Wrist 3.4 <=2.2 27 >=35 Median Palm - Wrist 8       Ulnar Palm Wrist 1.9 <=2.2 23 >=12 Ulnar Palm - Wrist 8          Median Palm - Ulnar Palm  1.5 <=0.4  L Median, Ulnar - Transcarpal comparison     Median Palm Wrist 2.7 <=2.2 20 >=35 Median Palm - Wrist 8       Ulnar Palm Wrist 2.1 <=2.2 25 >=12 Ulnar Palm - Wrist 8          Median Palm - Ulnar Palm  0.6 <=0.4  R Median - Orthodromic (Dig II, Mid palm)     Dig II Wrist 4.5 <=3.4 11 >=10 Dig II - Wrist 13    L Median - Orthodromic (Dig II, Mid palm)     Dig II Wrist 3.6 <=3.4 13 >=10 Dig II - Wrist 13    R Ulnar - Orthodromic, (Dig V, Mid palm)     Dig V Wrist 2.8 <=3.1 11 >=5 Dig V - Wrist 11    L Ulnar - Orthodromic, (Dig V, Mid palm)     Dig V Wrist 3.0 <=3.1 9 >=5 Dig V - Wrist 40                   F  Wave    Nerve F Lat Ref.   ms ms  R Ulnar - ADM 27.7 <=32.0  L Ulnar - ADM 25.6 <=32.0         EMG Summary Table    Spontaneous MUAP Recruitment  Muscle IA Fib PSW Fasc Other Amp Dur. Poly Pattern  R. Deltoid Normal None None None _______ Normal Normal Normal Normal  R. Biceps brachii Normal None None None _______ Normal Normal Normal Normal  R. Triceps brachii Normal None None None _______ Normal Normal Normal Normal  R. Flexor carpi radialis Normal None None None _______ Normal Normal Normal Normal  R. First dorsal interosseous Normal None None None _______ Normal Normal Normal Normal

## 2023-04-13 NOTE — Progress Notes (Signed)
   04/13/2023  Patient ID: Joan Pearson, female   DOB: 1965/07/12, 57 y.o.   MRN: [id number]  Patient rx for Diclofenac requiring a prior auth. Submitted today and response expected within 24 hours via fax. Reference ID: [id number].  Prior [employer]: [id number]

## 2023-04-14 ENCOUNTER — Other Ambulatory Visit: Payer: Self-pay | Admitting: Medical

## 2023-04-14 MED ORDER — NAPROXEN 500 MG PO TBEC: 500.0000 mg | DELAYED_RELEASE_TABLET | Freq: Two times a day (BID) | ORAL | 0 refills | Status: DC

## 2023-04-14 MED ORDER — TRAMADOL HCL 50 MG PO TABS
50.0000 mg | ORAL_TABLET | Freq: Two times a day (BID) | ORAL | 0 refills | Status: DC
Start: 2023-04-14 — End: 2023-04-26

## 2023-04-14 MED ORDER — PREGABALIN 100 MG PO CAPS: 100.0000 mg | ORAL_CAPSULE | Freq: Two times a day (BID) | ORAL | 1 refills | Status: DC

## 2023-04-14 NOTE — Telephone Encounter (Signed)
Called pt and informed

## 2023-04-14 NOTE — Telephone Encounter (Signed)
Pt has been taking the Lyrica but states it hasn't been helping, The P.A. is for Diclofenac & was completed yesterday but no response yet.

## 2023-04-18 ENCOUNTER — Other Ambulatory Visit: Payer: Self-pay | Admitting: Diagnostic Neuroimaging

## 2023-04-18 DIAGNOSIS — R9089 Other abnormal findings on diagnostic imaging of central nervous system: Secondary | ICD-10-CM

## 2023-04-19 ENCOUNTER — Ambulatory Visit: Payer: Medicaid Other | Admitting: Medical

## 2023-04-21 ENCOUNTER — Other Ambulatory Visit: Payer: Self-pay | Admitting: Medical

## 2023-04-24 NOTE — Telephone Encounter (Signed)
Looks like medication was changed to Lyrica

## 2023-04-26 ENCOUNTER — Ambulatory Visit (INDEPENDENT_AMBULATORY_CARE_PROVIDER_SITE_OTHER): Payer: Medicaid Other | Admitting: Medical

## 2023-04-26 ENCOUNTER — Telehealth: Payer: Self-pay | Admitting: Medical

## 2023-04-26 VITALS — BP 138/80 | HR 54 | Ht 67.5 in | Wt 188.6 lb

## 2023-04-26 DIAGNOSIS — J342 Deviated nasal septum: Secondary | ICD-10-CM | POA: Diagnosis not present

## 2023-04-26 DIAGNOSIS — Z90710 Acquired absence of both cervix and uterus: Secondary | ICD-10-CM

## 2023-04-26 DIAGNOSIS — Z13220 Encounter for screening for lipoid disorders: Secondary | ICD-10-CM

## 2023-04-26 DIAGNOSIS — R768 Other specified abnormal immunological findings in serum: Secondary | ICD-10-CM | POA: Diagnosis not present

## 2023-04-26 DIAGNOSIS — E559 Vitamin D deficiency, unspecified: Secondary | ICD-10-CM

## 2023-04-26 DIAGNOSIS — Z0000 Encounter for general adult medical examination without abnormal findings: Secondary | ICD-10-CM | POA: Diagnosis not present

## 2023-04-26 DIAGNOSIS — Z7185 Encounter for immunization safety counseling: Secondary | ICD-10-CM | POA: Diagnosis not present

## 2023-04-26 DIAGNOSIS — J3489 Other specified disorders of nose and nasal sinuses: Secondary | ICD-10-CM | POA: Diagnosis not present

## 2023-04-26 DIAGNOSIS — Z129 Encounter for screening for malignant neoplasm, site unspecified: Secondary | ICD-10-CM | POA: Diagnosis not present

## 2023-04-26 DIAGNOSIS — I10 Essential (primary) hypertension: Secondary | ICD-10-CM | POA: Diagnosis not present

## 2023-04-26 DIAGNOSIS — Z119 Encounter for screening for infectious and parasitic diseases, unspecified: Secondary | ICD-10-CM

## 2023-04-26 MED ORDER — TRAMADOL HCL 50 MG PO TABS: 50.0000 mg | ORAL_TABLET | Freq: Two times a day (BID) | ORAL | 0 refills | Status: DC | PRN

## 2023-04-26 MED ORDER — FLUTICASONE PROPIONATE 50 MCG/ACT NA SUSP: 2.0000 | Freq: Every day | NASAL | 6 refills | Status: AC

## 2023-04-26 MED ORDER — DICLOFENAC SODIUM 75 MG PO TBEC: 75.0000 mg | DELAYED_RELEASE_TABLET | Freq: Two times a day (BID) | ORAL | 1 refills | Status: DC

## 2023-04-26 NOTE — Telephone Encounter (Signed)
Spoke to pt's regarding OON exception to clarify what she told the front desk. I then called pt's insurance to try to clarify what the patient said. Pt's insurance stated that pt does not have an OON benefit. Stated the her pcp office would have to submit an authorization for an OON provider. The OON provider would have to accept a [employer] Acceptance and the Medicaid insurance rate. If they do not accept the rate then they have to negotiate. Gave information to [place] and will call pt to inform her of information as well.
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
## 2023-04-27 LAB — COMPREHENSIVE METABOLIC PANEL
ALT: 19 [IU]/L (ref 0–32)
AST: 19 IU/L (ref 0–40)
Albumin: 4.6 g/dL (ref 3.8–4.9)
Alkaline Phosphatase: 91 [IU]/L (ref 44–121)
BUN/Creatinine Ratio: 18 (ref 9–23)
BUN: 13 mg/dL (ref 6–24)
Bilirubin Total: 0.3 mg/dL (ref 0.0–1.2)
CO2: 27 mmol/L (ref 20–29)
Calcium: 9.6 mg/dL (ref 8.7–10.2)
Chloride: 105 mmol/L (ref 96–106)
Creatinine, Ser: 0.73 mg/dL (ref 0.57–1.00)
Globulin, Total: 2.6 g/dL (ref 1.5–4.5)
Glucose: 85 mg/dL (ref 70–99)
Potassium: 4.5 mmol/L (ref 3.5–5.2)
Sodium: 144 mmol/L (ref 134–144)
Total Protein: 7.2 g/dL (ref 6.0–8.5)
eGFR: 97 mL/min/{1.73_m2} (ref >59)

## 2023-04-27 LAB — LIPID PANEL
Chol/HDL Ratio: 3.1 ratio (ref 0.0–4.4)
Cholesterol, Total: 217 mg/dL — ABNORMAL HIGH (ref 100–199)
HDL: 71 mg/dL (ref >39)
LDL Chol Calc (NIH): 135 mg/dL — ABNORMAL HIGH (ref 0–99)
Triglycerides: 65 mg/dL (ref 0–149)
VLDL Cholesterol Cal: 11 mg/dL (ref 5–40)

## 2023-04-27 LAB — CBC
Hematocrit: 39.1 % (ref 34.0–46.6)
Hemoglobin: 12.1 g/dL (ref 11.1–15.9)
MCH: 26.3 pg — ABNORMAL LOW (ref 26.6–33.0)
MCHC: 30.9 g/dL — ABNORMAL LOW (ref 31.5–35.7)
MCV: 85 fL (ref 79–97)
Platelets: 236 10*3/uL (ref 150–450)
RBC: 4.6 x10E6/uL (ref 3.77–5.28)
RDW: 13.1 % (ref 11.7–15.4)
WBC: 7.1 10*3/uL (ref 3.4–10.8)

## 2023-04-27 LAB — HEPATITIS B SURFACE ANTIGEN: Hepatitis B Surface Ag: NEGATIVE

## 2023-04-27 LAB — HEPATITIS B CORE ANTIBODY, IGM: Hep B C IgM: NEGATIVE

## 2023-04-27 LAB — HEPATITIS B SURFACE ANTIBODY, QUANTITATIVE: Hepatitis B Surf Ab Quant: 2525 m[IU]/mL

## 2023-04-27 NOTE — Progress Notes (Signed)
abstracted 
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
## 2023-04-27 NOTE — Progress Notes (Signed)
Hello to you both.  I had placed a referral to rheumatology but apparently the soonest she could be seen by your office is I believe spring 2025.  Her insurance apparently does not give her a lot of options or out of network benefits.  Per our referral coordinator the only place we can get her in is with your office.  She has polyarthralgias, including hand involvement and joint stiffness.  Her exam findings are not all that significant but she does have symptoms.  Her recent CCP antibody was positive.  I do not normally feel comfortable prescribing medicines for suspected RA, but if you guys cannot get her in till 2 or 3 months from now, would you suggest to start her on something like Plaquenil or Humira?  She currently is getting some control of pain and inflammation with diclofenac and tramadol.  I also have tried her on Lyrica for general pain.    I know there are certain screening labs which are done before iniating Humira, and she had some labs yesterday.  Just looking for guidance unless yall can get her in sooner.   Thanks [employer]

## 2023-04-27 NOTE — Telephone Encounter (Signed)
P.A. says generic not covered called pharmacy & brand not covered either, Shane sent in alternative

## 2023-04-27 NOTE — Telephone Encounter (Signed)
Pt was informed.

## 2023-04-27 NOTE — Discharge Instructions (Signed)

## 2023-04-27 NOTE — Progress Notes (Signed)
Hi Shane,   I would be happy to see this patient sooner--can probably work her in within the next couple of weeks.  I will send a message to our front office staff to call to schedule a sooner visit with me.

## 2023-04-28 ENCOUNTER — Ambulatory Visit
Admission: RE | Admit: 2023-04-28 | Discharge: 2023-04-28 | Disposition: A | Discharge status: Home / Self Care | Payer: Medicaid Other | Source: Ambulatory Visit | Attending: Diagnostic Neuroimaging | Admitting: Diagnostic Neuroimaging

## 2023-04-28 ENCOUNTER — Encounter: Payer: Self-pay | Admitting: Internal Medicine

## 2023-04-28 DIAGNOSIS — R9089 Other abnormal findings on diagnostic imaging of central nervous system: Secondary | ICD-10-CM

## 2023-04-28 NOTE — Progress Notes (Signed)
She is scheduled with Dr. Corliss Skains next Thursday now!  Please reach out in the future if this ever happens again-we'll try to work people in sooner if we can

## 2023-04-28 NOTE — Progress Notes (Signed)
Results sent through MyChart

## 2023-04-28 NOTE — Progress Notes (Signed)
1 vial of blood taken from pts LAC to be sent off with LP lab work. 1 successful attempt. Pt tolerated well. Guaze and tape applied after.

## 2023-05-01 ENCOUNTER — Other Ambulatory Visit: Payer: Self-pay | Admitting: Medical

## 2023-05-01 DIAGNOSIS — Z136 Encounter for screening for cardiovascular disorders: Secondary | ICD-10-CM

## 2023-05-03 LAB — CSF CELL COUNT WITH DIFFERENTIAL
RBC Count, CSF: 7 {cells}/uL — ABNORMAL HIGH
TOTAL NUCLEATED CELL: 2 {cells}/uL (ref 0–5)

## 2023-05-03 LAB — CNS IGG SYNTHESIS RATE, CSF+BLOOD
Albumin Serum: 4.3 g/dL (ref 3.6–5.1)
Albumin, CSF: 13.5 mg/dL (ref 8.0–42.0)
CNS-IgG Synthesis Rate: -1.6 mg/(24.h) (ref <=3.3)
IgG (Immunoglobin G), Serum: 1200 mg/dL (ref 600–1640)
IgG Total CSF: 2.3 mg/dL (ref 0.8–7.7)
IgG-Index: 0.61 (ref <0.70)

## 2023-05-03 LAB — GLUCOSE, CSF: Glucose, CSF: 51 mg/dL (ref 40–80)

## 2023-05-03 LAB — CSF CULTURE W GRAM STAIN
MICRO NUMBER:: 15674972
Result:: NO GROWTH
SPECIMEN QUALITY:: ADEQUATE

## 2023-05-03 LAB — PROTEIN, CSF: Total Protein, CSF: 32 mg/dL (ref 15–45)

## 2023-05-03 LAB — OLIGOCLONAL BANDS, CSF + SERM: Oligo Bands: ABSENT

## 2023-05-04 ENCOUNTER — Ambulatory Visit: Payer: Medicaid Other | Attending: Rheumatology | Admitting: Rheumatology

## 2023-05-04 ENCOUNTER — Ambulatory Visit: Payer: Medicaid Other

## 2023-05-04 ENCOUNTER — Telehealth: Payer: Self-pay

## 2023-05-04 ENCOUNTER — Encounter: Payer: Self-pay | Admitting: Rheumatology

## 2023-05-04 VITALS — BP 152/95 | HR 54 | Resp 15 | Ht 67.5 in | Wt 186.0 lb

## 2023-05-04 DIAGNOSIS — M25561 Pain in right knee: Secondary | ICD-10-CM | POA: Diagnosis not present

## 2023-05-04 DIAGNOSIS — M79671 Pain in right foot: Secondary | ICD-10-CM

## 2023-05-04 DIAGNOSIS — G5603 Carpal tunnel syndrome, bilateral upper limbs: Secondary | ICD-10-CM | POA: Diagnosis not present

## 2023-05-04 DIAGNOSIS — Z9889 Other specified postprocedural states: Secondary | ICD-10-CM | POA: Diagnosis not present

## 2023-05-04 DIAGNOSIS — S63592S Other specified sprain of left wrist, sequela: Secondary | ICD-10-CM

## 2023-05-04 DIAGNOSIS — M5030 Other cervical disc degeneration, unspecified cervical region: Secondary | ICD-10-CM

## 2023-05-04 DIAGNOSIS — R768 Other specified abnormal immunological findings in serum: Secondary | ICD-10-CM | POA: Diagnosis not present

## 2023-05-04 DIAGNOSIS — G8929 Other chronic pain: Secondary | ICD-10-CM

## 2023-05-04 DIAGNOSIS — I10 Essential (primary) hypertension: Secondary | ICD-10-CM

## 2023-05-04 DIAGNOSIS — M79672 Pain in left foot: Secondary | ICD-10-CM | POA: Diagnosis not present

## 2023-05-04 DIAGNOSIS — M5481 Occipital neuralgia: Secondary | ICD-10-CM | POA: Diagnosis not present

## 2023-05-04 DIAGNOSIS — Z87891 Personal history of nicotine dependence: Secondary | ICD-10-CM

## 2023-05-04 DIAGNOSIS — M79642 Pain in left hand: Secondary | ICD-10-CM

## 2023-05-04 DIAGNOSIS — Z96652 Presence of left artificial knee joint: Secondary | ICD-10-CM | POA: Diagnosis not present

## 2023-05-04 DIAGNOSIS — F5101 Primary insomnia: Secondary | ICD-10-CM

## 2023-05-04 DIAGNOSIS — M79641 Pain in right hand: Secondary | ICD-10-CM

## 2023-05-04 DIAGNOSIS — M47816 Spondylosis without myelopathy or radiculopathy, lumbar region: Secondary | ICD-10-CM | POA: Diagnosis not present

## 2023-05-04 DIAGNOSIS — M2550 Pain in unspecified joint: Secondary | ICD-10-CM

## 2023-05-04 DIAGNOSIS — Z9081 Acquired absence of spleen: Secondary | ICD-10-CM

## 2023-05-04 DIAGNOSIS — M791 Myalgia, unspecified site: Secondary | ICD-10-CM

## 2023-05-04 DIAGNOSIS — E559 Vitamin D deficiency, unspecified: Secondary | ICD-10-CM

## 2023-05-04 NOTE — Telephone Encounter (Signed)
-----   Message from [facility] sent at 05/03/2023  7:22 PM EST ----- Spinal tap labs are normal. No sign of MS at this time. Monitor symptoms for now. -VRP

## 2023-05-04 NOTE — Patient Instructions (Addendum)
Exercises for Chronic Knee Pain Chronic knee pain is pain that lasts longer than 3 months. For most people with chronic knee pain, exercise and weight loss is an important part of treatment. Your health care provider may want you to focus on: Making the muscles that support your knee stronger. This can take pressure off your knee and reduce pain. Preventing knee stiffness. How far you can move your knee, keeping it there or making it farther. Losing weight (if this applies) to take pressure off your knee, lower your risk for injury, and make it easier for you to exercise. Your provider will help you make an exercise program that fits your needs and physical abilities. Below are simple, low-impact exercises you can do at home. Ask your provider or physical therapist how often you should do your exercise program and how many times to repeat each exercise. General safety tips  Get your provider's approval before doing any exercises. Start slowly and stop any time you feel pain. Do not exercise if your knee pain is flaring up. Warm up first. Stretching a cold muscle can cause an injury. Do 5-10 minutes of easy movement or light stretching before beginning your exercises. Do 5-10 minutes of low-impact activity (like walking or cycling) before starting strengthening exercises. Contact your provider any time you have pain during or after exercising. Exercise can cause discomfort but should not be painful. It is normal to be a little stiff or sore after exercising. Stretching and range-of-motion exercises Front thigh stretch  Stand up straight and support your body by holding on to a chair or resting one hand on a wall. With your legs straight and close together, bend one knee to lift your heel up toward your butt. Using one hand for support, grab your ankle with your free hand. Pull your foot up closer toward your butt to feel the stretch in front of your thigh. Hold the stretch for 30  seconds. Repeat __________ times. Complete this exercise __________ times a day. Back thigh stretch  Sit on the floor with your back straight and your legs out straight in front of you. Place the palms of your hands on the floor and slide them toward your feet as you bend at the hip. Try to touch your nose to your knees and feel the stretch in the back of your thighs. Hold for 30 seconds. Repeat __________ times. Complete this exercise __________ times a day. Calf stretch  Stand facing a wall. Place the palms of your hands flat against the wall, arms extended, and lean slightly against the wall. Get into a lunge position with one leg bent at the knee and the other leg stretched out straight behind you. Keep both feet facing the wall and increase the bend in your knee while keeping the heel of the other leg flat on the ground. You should feel the stretch in your calf. Hold for 30 seconds. Repeat __________ times. Complete this exercise __________ times a day. Strengthening exercises Straight leg lift  Lie on your back with one knee bent and the other leg out straight. Slowly lift the straight leg without bending the knee. Lift until your foot is about 12 inches (30 cm) off the floor. Hold for 3-5 seconds and slowly lower your leg. Repeat __________ times. Complete this exercise __________ times a day. Single leg dip  Stand between two chairs and put both hands on the backs of the chairs for support. Extend one leg out straight with your body   weight resting on the heel of the standing leg. Slowly bend your standing knee to dip your body to the level that is comfortable for you. Hold for 3-5 seconds. Repeat __________ times. Complete this exercise __________ times a day. Hamstring curls  Stand straight, knees close together, facing the back of a chair. Hold on to the back of a chair with both hands. Keep one leg straight. Bend the other knee while bringing the heel up toward the butt  until the knee is bent at a 90-degree angle (right angle). Hold for 3-5 seconds. Repeat __________ times. Complete this exercise __________ times a day. Wall squat  Stand straight with your back, hips, and head against a wall. Step forward one foot at a time with your back still against the wall. Your feet should be 2 feet (61 cm) from the wall at shoulder width. Keeping your back, hips, and head against the wall, slide down the wall to as close to a sitting position as you can get. Hold for 5-10 seconds, then slowly slide back up. Repeat __________ times. Complete this exercise __________ times a day. Step-ups  Stand in front of a sturdy platform or stool that is about 6 inches (15 cm) high. Slowly step up with your left / right foot, keeping your knee in line with your hip and foot. Do not let your knee bend so far that you cannot see your toes. Hold on to a chair for balance, but do not use it for support. Slowly unlock your knee and lower yourself to the starting position. Repeat __________ times. Complete this exercise __________ times a day. Contact a health care provider if: Your exercises cause pain. Your pain is worse after you exercise. Your pain prevents you from doing your exercises. This information is not intended to replace advice given to you by your health care provider. Make sure you discuss any questions you have with your health care provider. Document Revised: 06/28/2022 Document Reviewed: 06/28/2022 Elsevier Patient Education  2024 [employer]  Hand Exercises Hand exercises can be helpful for almost anyone. They can strengthen your hands and improve flexibility and movement. The exercises can also increase blood flow to the hands. These results can make your work and daily tasks easier for you. Hand exercises can be especially helpful for people who have joint pain from arthritis or nerve damage from using their hands over and over. These exercises can also help  people who injure a hand. Exercises Most of these hand exercises are gentle stretching and motion exercises. It is usually safe to do them often throughout the day. Warming up your hands before exercise may help reduce stiffness. You can do this with gentle massage or by placing your hands in warm water for 10-15 minutes. It is normal to feel some stretching, pulling, tightness, or mild discomfort when you begin new exercises. In time, this will improve. Remember to always be careful and stop right away if you feel sudden, very bad pain or your pain gets worse. You want to get better and be safe. Ask your health care provider which exercises are safe for you. Do exercises exactly as told by your provider and adjust them as told. Do not begin these exercises until told by your provider. Knuckle bend or "claw" fist  Stand or sit with your arm, hand, and all five fingers pointed straight up. Make sure to keep your wrist straight. Gently bend your fingers down toward your palm until the tips of your   fingers are touching your palm. Keep your big knuckle straight and only bend the small knuckles in your fingers. Hold this position for 10 seconds. Straighten your fingers back to your starting position. Repeat this exercise 5-10 times with each hand. Full finger fist  Stand or sit with your arm, hand, and all five fingers pointed straight up. Make sure to keep your wrist straight. Gently bend your fingers into your palm until the tips of your fingers are touching the middle of your palm. Hold this position for 10 seconds. Extend your fingers back to your starting position, stretching every joint fully. Repeat this exercise 5-10 times with each hand. Straight fist  Stand or sit with your arm, hand, and all five fingers pointed straight up. Make sure to keep your wrist straight. Gently bend your fingers at the big knuckle, where your fingers meet your hand, and at the middle knuckle. Keep the knuckle at  the tips of your fingers straight and try to touch the bottom of your palm. Hold this position for 10 seconds. Extend your fingers back to your starting position, stretching every joint fully. Repeat this exercise 5-10 times with each hand. Tabletop  Stand or sit with your arm, hand, and all five fingers pointed straight up. Make sure to keep your wrist straight. Gently bend your fingers at the big knuckle, where your fingers meet your hand, as far down as you can. Keep the small knuckles in your fingers straight. Think of forming a tabletop with your fingers. Hold this position for 10 seconds. Extend your fingers back to your starting position, stretching every joint fully. Repeat this exercise 5-10 times with each hand. Finger spread  Place your hand flat on a table with your palm facing down. Make sure your wrist stays straight. Spread your fingers and thumb apart from each other as far as you can until you feel a gentle stretch. Hold this position for 10 seconds. Bring your fingers and thumb tight together again. Hold this position for 10 seconds. Repeat this exercise 5-10 times with each hand. Making circles  Stand or sit with your arm, hand, and all five fingers pointed straight up. Make sure to keep your wrist straight. Make a circle by touching the tip of your thumb to the tip of your index finger. Hold for 10 seconds. Then open your hand wide. Repeat this motion with your thumb and each of your fingers. Repeat this exercise 5-10 times with each hand. Thumb motion  Sit with your forearm resting on a table and your wrist straight. Your thumb should be facing up toward the ceiling. Keep your fingers relaxed as you move your thumb. Lift your thumb up as high as you can toward the ceiling. Hold for 10 seconds. Bend your thumb across your palm as far as you can, reaching the tip of your thumb for the small finger (pinkie) side of your palm. Hold for 10 seconds. Repeat this exercise  5-10 times with each hand. Grip strengthening  Hold a stress ball or other soft ball in the middle of your hand. Slowly increase the pressure, squeezing the ball as much as you can without causing pain. Think of bringing the tips of your fingers into the middle of your palm. All of your finger joints should bend when doing this exercise. Hold your squeeze for 10 seconds, then relax. Repeat this exercise 5-10 times with each hand. Contact a health care provider if: Your hand pain or discomfort gets much worse when   you do an exercise. Your hand pain or discomfort does not improve within 2 hours after you exercise. If you have either of these problems, stop doing these exercises right away. Do not do them again unless your provider says that you can. Get help right away if: You develop sudden, severe hand pain or swelling. If this happens, stop doing these exercises right away. Do not do them again unless your provider says that you can. This information is not intended to replace advice given to you by your health care provider. Make sure you discuss any questions you have with your health care provider. Document Revised: 06/28/2022 Document Reviewed: 06/28/2022 Elsevier Patient Education  2024 [employer] Inc.  

## 2023-05-04 NOTE — Telephone Encounter (Signed)
Called patient to informed her that her labs are normal. Pt verbalized understanding. Pt had no questions at this time but was encouraged to call back if questions arise.

## 2023-05-08 LAB — CYCLIC CITRUL PEPTIDE ANTIBODY, IGG: Cyclic Citrullin Peptide Ab: 188 U — ABNORMAL HIGH

## 2023-05-08 LAB — RHEUMATOID FACTOR: Rheumatoid fact SerPl-aCnc: <10 [IU]/mL (ref <14)

## 2023-05-08 LAB — SEDIMENTATION RATE: Sed Rate: 6 mm/h (ref 0–30)

## 2023-05-08 NOTE — Progress Notes (Signed)
Anti-CCP positive, sed rate normal, rheumatoid factor negative.  I will discuss results at the follow-up visit.

## 2023-05-10 ENCOUNTER — Ambulatory Visit (HOSPITAL_COMMUNITY): Payer: Medicaid Other

## 2023-05-12 ENCOUNTER — Other Ambulatory Visit: Payer: Self-pay | Admitting: Medical
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
## 2023-05-28 ENCOUNTER — Telehealth: Payer: Self-pay | Admitting: Medical

## 2023-05-28 ENCOUNTER — Other Ambulatory Visit: Payer: Self-pay | Admitting: Medical

## 2023-05-28 MED ORDER — CELECOXIB 100 MG PO CAPS: 100.0000 mg | ORAL_CAPSULE | Freq: Two times a day (BID) | ORAL | 0 refills | Status: DC

## 2023-05-28 NOTE — Telephone Encounter (Signed)
P.A  previously denied pt needs trial of 2 preferred celecoxib capsule, ibuprofen suspension / tablet, indomethacin capsule, naproxen EC / DR tablet, naproxen tablet, sulindac tablet.  Pt has tried naproxen, can she be switched to different preferred?

## 2023-06-06 ENCOUNTER — Ambulatory Visit: Payer: Medicaid Other | Admitting: Rheumatology

## 2023-06-06 DIAGNOSIS — I10 Essential (primary) hypertension: Secondary | ICD-10-CM

## 2023-06-06 DIAGNOSIS — M791 Myalgia, unspecified site: Secondary | ICD-10-CM

## 2023-06-06 DIAGNOSIS — M1711 Unilateral primary osteoarthritis, right knee: Secondary | ICD-10-CM

## 2023-06-06 DIAGNOSIS — Z9081 Acquired absence of spleen: Secondary | ICD-10-CM

## 2023-06-06 DIAGNOSIS — M5481 Occipital neuralgia: Secondary | ICD-10-CM

## 2023-06-06 DIAGNOSIS — M47816 Spondylosis without myelopathy or radiculopathy, lumbar region: Secondary | ICD-10-CM

## 2023-06-06 DIAGNOSIS — E559 Vitamin D deficiency, unspecified: Secondary | ICD-10-CM

## 2023-06-06 DIAGNOSIS — M19071 Primary osteoarthritis, right ankle and foot: Secondary | ICD-10-CM

## 2023-06-06 DIAGNOSIS — S63592S Other specified sprain of left wrist, sequela: Secondary | ICD-10-CM

## 2023-06-06 DIAGNOSIS — M5030 Other cervical disc degeneration, unspecified cervical region: Secondary | ICD-10-CM

## 2023-06-06 DIAGNOSIS — G5603 Carpal tunnel syndrome, bilateral upper limbs: Secondary | ICD-10-CM

## 2023-06-06 DIAGNOSIS — Z87891 Personal history of nicotine dependence: Secondary | ICD-10-CM

## 2023-06-06 DIAGNOSIS — M19041 Primary osteoarthritis, right hand: Secondary | ICD-10-CM

## 2023-06-06 DIAGNOSIS — M2550 Pain in unspecified joint: Secondary | ICD-10-CM

## 2023-06-06 DIAGNOSIS — F5101 Primary insomnia: Secondary | ICD-10-CM

## 2023-06-06 DIAGNOSIS — Z96652 Presence of left artificial knee joint: Secondary | ICD-10-CM

## 2023-06-06 DIAGNOSIS — R768 Other specified abnormal immunological findings in serum: Secondary | ICD-10-CM

## 2023-06-08 ENCOUNTER — Encounter: Payer: Self-pay | Admitting: Rheumatology

## 2023-06-08 ENCOUNTER — Encounter (HOSPITAL_COMMUNITY): Payer: Self-pay

## 2023-06-08 ENCOUNTER — Ambulatory Visit (HOSPITAL_COMMUNITY): Payer: Medicaid Other

## 2023-06-08 ENCOUNTER — Ambulatory Visit (HOSPITAL_COMMUNITY)
Admission: EM | Admit: 2023-06-08 | Discharge: 2023-06-08 | Disposition: A | Discharge status: Home / Self Care | Payer: Medicaid Other | Attending: Family Medicine | Admitting: Family Medicine

## 2023-06-08 DIAGNOSIS — R071 Chest pain on breathing: Secondary | ICD-10-CM

## 2023-06-08 DIAGNOSIS — Y92009 Unspecified place in unspecified non-institutional (private) residence as the place of occurrence of the external cause: Secondary | ICD-10-CM | POA: Diagnosis not present

## 2023-06-08 DIAGNOSIS — S299XXA Unspecified injury of thorax, initial encounter: Secondary | ICD-10-CM | POA: Diagnosis not present

## 2023-06-08 DIAGNOSIS — R0781 Pleurodynia: Secondary | ICD-10-CM

## 2023-06-08 MED ORDER — KETOROLAC TROMETHAMINE 30 MG/ML IJ SOLN: 60.0000 mg | Freq: Once | INTRAMUSCULAR | Status: DC

## 2023-06-08 MED ORDER — TRAMADOL HCL 50 MG PO TABS: 50.0000 mg | ORAL_TABLET | Freq: Two times a day (BID) | ORAL | 0 refills | Status: DC | PRN

## 2023-06-08 MED ORDER — KETOROLAC TROMETHAMINE 60 MG/2ML IM SOLN
60.0000 mg | Freq: Once | INTRAMUSCULAR | Status: AC
  Administered 2023-06-08: 60 mg via INTRAMUSCULAR

## 2023-06-08 MED ORDER — KETOROLAC TROMETHAMINE 60 MG/2ML IM SOLN
INTRAMUSCULAR | Status: AC
  Filled 2023-06-08: qty 2
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
## 2023-06-08 NOTE — Discharge Instructions (Signed)
Recommend take Tramadol 50mg  every 12 hours as needed for severe pain. Continue to use Diclofenac as directed as well as continue Lyrica use. May apply cool compresses to area for comfort and alternate with heat as needed. Rest. Continue to try to take deep breaths to reduce developing any lung illness. If pain gets more severe and having more difficulty breathing, go to the ER ASAP. Otherwise, follow-up with your PCP as planned.
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
## 2023-06-12 ENCOUNTER — Telehealth (INDEPENDENT_AMBULATORY_CARE_PROVIDER_SITE_OTHER): Payer: Medicaid Other | Admitting: Medical

## 2023-06-12 DIAGNOSIS — S20211D Contusion of right front wall of thorax, subsequent encounter: Secondary | ICD-10-CM | POA: Diagnosis not present

## 2023-06-12 DIAGNOSIS — R0781 Pleurodynia: Secondary | ICD-10-CM | POA: Diagnosis not present

## 2023-06-12 MED ORDER — HYDROCODONE-ACETAMINOPHEN 5-325 MG PO TABS: 1.0000 | ORAL_TABLET | Freq: Two times a day (BID) | ORAL | 0 refills | Status: DC | PRN
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
## 2023-06-15 ENCOUNTER — Telehealth: Payer: Medicaid Other | Admitting: Medical

## 2023-06-15 ENCOUNTER — Ambulatory Visit: Payer: Medicaid Other | Attending: Rheumatology | Admitting: Rheumatology

## 2023-06-15 ENCOUNTER — Encounter: Payer: Self-pay | Admitting: Rheumatology

## 2023-06-15 VITALS — BP 185/104 | HR 70 | Resp 16 | Ht 67.0 in | Wt 183.4 lb

## 2023-06-15 DIAGNOSIS — I10 Essential (primary) hypertension: Secondary | ICD-10-CM

## 2023-06-15 DIAGNOSIS — Z96652 Presence of left artificial knee joint: Secondary | ICD-10-CM | POA: Diagnosis not present

## 2023-06-15 DIAGNOSIS — M2550 Pain in unspecified joint: Secondary | ICD-10-CM

## 2023-06-15 DIAGNOSIS — Z9889 Other specified postprocedural states: Secondary | ICD-10-CM | POA: Diagnosis not present

## 2023-06-15 DIAGNOSIS — M79641 Pain in right hand: Secondary | ICD-10-CM

## 2023-06-15 DIAGNOSIS — M19072 Primary osteoarthritis, left ankle and foot: Secondary | ICD-10-CM

## 2023-06-15 DIAGNOSIS — M5481 Occipital neuralgia: Secondary | ICD-10-CM

## 2023-06-15 DIAGNOSIS — Z9081 Acquired absence of spleen: Secondary | ICD-10-CM

## 2023-06-15 DIAGNOSIS — R768 Other specified abnormal immunological findings in serum: Secondary | ICD-10-CM | POA: Diagnosis not present

## 2023-06-15 DIAGNOSIS — G5603 Carpal tunnel syndrome, bilateral upper limbs: Secondary | ICD-10-CM

## 2023-06-15 DIAGNOSIS — Z79899 Other long term (current) drug therapy: Secondary | ICD-10-CM | POA: Diagnosis not present

## 2023-06-15 DIAGNOSIS — F5101 Primary insomnia: Secondary | ICD-10-CM

## 2023-06-15 DIAGNOSIS — M059 Rheumatoid arthritis with rheumatoid factor, unspecified: Secondary | ICD-10-CM | POA: Diagnosis not present

## 2023-06-15 DIAGNOSIS — M25561 Pain in right knee: Secondary | ICD-10-CM | POA: Diagnosis not present

## 2023-06-15 DIAGNOSIS — E559 Vitamin D deficiency, unspecified: Secondary | ICD-10-CM

## 2023-06-15 DIAGNOSIS — M19071 Primary osteoarthritis, right ankle and foot: Secondary | ICD-10-CM | POA: Diagnosis not present

## 2023-06-15 DIAGNOSIS — Z87891 Personal history of nicotine dependence: Secondary | ICD-10-CM

## 2023-06-15 DIAGNOSIS — M25511 Pain in right shoulder: Secondary | ICD-10-CM

## 2023-06-15 DIAGNOSIS — M47816 Spondylosis without myelopathy or radiculopathy, lumbar region: Secondary | ICD-10-CM

## 2023-06-15 DIAGNOSIS — M79642 Pain in left hand: Secondary | ICD-10-CM

## 2023-06-15 DIAGNOSIS — M791 Myalgia, unspecified site: Secondary | ICD-10-CM

## 2023-06-15 DIAGNOSIS — S63592S Other specified sprain of left wrist, sequela: Secondary | ICD-10-CM

## 2023-06-15 DIAGNOSIS — M5030 Other cervical disc degeneration, unspecified cervical region: Secondary | ICD-10-CM | POA: Diagnosis not present

## 2023-06-15 DIAGNOSIS — G8929 Other chronic pain: Secondary | ICD-10-CM

## 2023-06-15 MED ORDER — HYDROXYCHLOROQUINE SULFATE 200 MG PO TABS
200.0000 mg | ORAL_TABLET | Freq: Two times a day (BID) | ORAL | 2 refills | Status: DC
Start: 2023-06-15 — End: 2023-09-11

## 2023-06-15 NOTE — Patient Instructions (Addendum)
Hydroxychloroquine Tablets What is this medication? HYDROXYCHLOROQUINE (hye drox ee KLOR oh kwin) treats autoimmune conditions, such as rheumatoid arthritis and lupus. It works by slowing down an overactive immune system. It may also be used to prevent and treat malaria. It works by killing the parasite that causes malaria. It belongs to a group of medications called DMARDs. This medicine may be used for other purposes; ask your health care provider or pharmacist if you have questions. COMMON BRAND NAME(S): Plaquenil, Quineprox, SOVUNA What should I tell my care team before I take this medication? They need to know if you have any of these conditions: Diabetes Eye disease, vision problems Frequently drink alcohol G6PD deficiency Heart disease Irregular heartbeat or rhythm Kidney disease Liver disease Porphyria Psoriasis An unusual or allergic reaction to hydroxychloroquine, other medications, foods, dyes, or preservatives Pregnant or trying to get pregnant Breastfeeding How should I use this medication? Take this medication by mouth with water. Take it as directed on the prescription label. Do not cut, crush, or chew this medication. Swallow the tablets whole. Take it with food. Do not take it more than directed. Take all of this medication unless your care team tells you to stop it early. Keep taking it even if you think you are better. Take products with antacids in them at a different time of day than this medication. Take this medication 4 hours before or 4 hours after antacids. Talk to your care team if you have questions. Talk to your care team about the use of this medication in children. While this medication may be prescribed for selected conditions, precautions do apply. Overdosage: If you think you have taken too much of this medicine contact a poison control center or emergency room at once. NOTE: This medicine is only for you. Do not share this medicine with others. What if I  miss a dose? If you miss a dose, take it as soon as you can. If it is almost time for your next dose, take only that dose. Do not take double or extra doses. What may interact with this medication? Do not take this medication with any of the following: Cisapride Dronedarone Pimozide Thioridazine This medication may also interact with the following: Ampicillin Antacids Cimetidine Cyclosporine Digoxin Kaolin Medications for diabetes, such as insulin, glipizide, glyburide Medications for seizures, such as carbamazepine, phenobarbital, phenytoin Mefloquine Methotrexate Other medications that cause heart rhythm changes Praziquantel This list may not describe all possible interactions. Give your health care provider a list of all the medicines, herbs, non-prescription drugs, or dietary supplements you use. Also tell them if you smoke, drink alcohol, or use illegal drugs. Some items may interact with your medicine. What should I watch for while using this medication? Visit your care team for regular checks on your progress. Tell your care team if your symptoms do not start to get better or if they get worse. You may need blood work done while you are taking this medication. If you take other medications that can affect heart rhythm, you may need more testing. Talk to your care team if you have questions. Your vision may be tested before and during use of this medication. Tell your care team right away if you have any change in your eyesight. This medication may cause serious skin reactions. They can happen weeks to months after starting the medication. Contact your care team right away if you notice fevers or flu-like symptoms with a rash. The rash may be red or purple and then  turn into blisters or peeling of the skin. Or, you might notice a red rash with swelling of the face, lips or lymph nodes in your neck or under your arms. If you or your family notice any changes in your behavior, such as  new or worsening depression, thoughts of harming yourself, anxiety, or other unusual or disturbing thoughts, or memory loss, call your care team right away. What side effects may I notice from receiving this medication? Side effects that you should report to your care team as soon as possible: Allergic reactions--skin rash, itching, hives, swelling of the face, lips, tongue, or throat Aplastic anemia--unusual weakness or fatigue, dizziness, headache, trouble breathing, increased bleeding or bruising Change in vision Heart rhythm changes--fast or irregular heartbeat, dizziness, feeling faint or lightheaded, chest pain, trouble breathing Infection--fever, chills, cough, or sore throat Low blood sugar (hypoglycemia)--tremors or shaking, anxiety, sweating, cold or clammy skin, confusion, dizziness, rapid heartbeat Muscle injury--unusual weakness or fatigue, muscle pain, dark yellow or brown urine, decrease in amount of urine Pain, tingling, or numbness in the hands or feet Rash, fever, and swollen lymph nodes Redness, blistering, peeling, or loosening of the skin, including inside the mouth Thoughts of suicide or self-harm, worsening mood, or feelings of depression Unusual bruising or bleeding Side effects that usually do not require medical attention (report to your care team if they continue or are bothersome): Diarrhea Headache Nausea Stomach pain Vomiting This list may not describe all possible side effects. Call your doctor for medical advice about side effects. You may report side effects to FDA at 1-800-FDA-1088. Where should I keep my medication? Keep out of the reach of children and pets. Store at room temperature up to 30 degrees C (86 degrees F). Protect from light. Get rid of any unused medication after the expiration date. To get rid of medications that are no longer needed or have expired: Take the medication to a medication take-back program. Check with your pharmacy or law  enforcement to find a location. If you cannot return the medication, check the label or package insert to see if the medication should be thrown out in the garbage or flushed down the toilet. If you are not sure, ask your care team. If it is safe to put it in the trash, empty the medication out of the container. Mix the medication with cat litter, dirt, coffee grounds, or other unwanted substance. Seal the mixture in a bag or container. Put it in the trash. NOTE: This sheet is a summary. It may not cover all possible information. If you have questions about this medicine, talk to your doctor, pharmacist, or health care provider.  2024 Elsevier/Gold Standard (2021-12-20 00:00:00)  Standing Labs We placed an order today for your standing lab work.   Please have your standing labs drawn in 1 month, 3 months and then every 5 months  Please have your labs drawn 2 weeks prior to your appointment so that the provider can discuss your lab results at your appointment, if possible.  Please note that you may see your imaging and lab results in MyChart before we have reviewed them. We will contact you once all results are reviewed. Please allow our office up to 72 hours to thoroughly review all of the results before contacting the office for clarification of your results.  WALK-IN LAB HOURS  Monday through Thursday from 8:00 am -12:30 pm and 1:00 pm-5:00 pm and Friday from 8:00 am-12:00 pm.  Patients with office visits requiring labs will  be seen before walk-in labs.  You may encounter longer than normal wait times. Please allow additional time. Wait times may be shorter on  Monday and Thursday afternoons.  We do not book appointments for walk-in labs. We appreciate your patience and understanding with our staff.   Labs are drawn by [facility]. Please bring your co-pay at the time of your lab draw.  You may receive a bill from [facility] for your lab work.  Please note if you are on Hydroxychloroquine and and an  order has been placed for a Hydroxychloroquine level,  you will need to have it drawn 4 hours or more after your last dose.  If you wish to have your labs drawn at another location, please call the office 24 hours in advance so we can fax the orders.  The office is located at [address], [address], [place], [place] [address]   If you have any questions regarding directions or hours of operation,  please call [phone].   As a reminder, please drink plenty of water prior to coming for your lab work. Thanks!   Vaccines You are taking a medication(s) that can suppress your immune system.  The following immunizations are recommended: Flu annually Covid-19  RSV Td/Tdap (tetanus, diphtheria, pertussis) every 10 years Pneumonia (Prevnar 15 then Pneumovax 23 at least 1 year apart.  Alternatively, can take Prevnar 20 without needing additional dose) Shingrix: 2 doses from 4 weeks to 6 months apart  Please check with your PCP to make sure you are up to date.

## 2023-07-05 ENCOUNTER — Other Ambulatory Visit: Payer: Self-pay | Admitting: Medical

## 2023-08-30 ENCOUNTER — Encounter: Payer: BC Managed Care – PPO | Admitting: Rheumatology

## 2023-09-11 ENCOUNTER — Other Ambulatory Visit: Payer: Self-pay | Admitting: Medical

## 2023-09-11 ENCOUNTER — Other Ambulatory Visit: Payer: Self-pay | Admitting: Rheumatology

## 2023-09-11 DIAGNOSIS — M059 Rheumatoid arthritis with rheumatoid factor, unspecified: Secondary | ICD-10-CM

## 2023-09-11 NOTE — Telephone Encounter (Signed)
 Last Fill: 06/15/2023  Eye exam: I called patient, Patient will call Dr. Laruth Bouchard office to schedule appt.  Labs: 04/26/2023 MCH 26.3, MCHC 30.9,   Next Visit: 09/27/2023  Last Visit: 06/15/2023   UJ:[id number] rheumatoid arthritis   Current Dose per office note 06/15/2023: hydroxychloroquine 200 mg p.o. twice daily.   Okay to refill Plaquenil?
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
## 2023-09-27 ENCOUNTER — Ambulatory Visit: Payer: Medicaid Other | Admitting: Rheumatology

## 2023-09-27 DIAGNOSIS — F5101 Primary insomnia: Secondary | ICD-10-CM

## 2023-09-27 DIAGNOSIS — M25511 Pain in right shoulder: Secondary | ICD-10-CM

## 2023-09-27 DIAGNOSIS — Z79899 Other long term (current) drug therapy: Secondary | ICD-10-CM

## 2023-09-27 DIAGNOSIS — Z9081 Acquired absence of spleen: Secondary | ICD-10-CM

## 2023-09-27 DIAGNOSIS — M059 Rheumatoid arthritis with rheumatoid factor, unspecified: Secondary | ICD-10-CM

## 2023-09-27 DIAGNOSIS — S63592S Other specified sprain of left wrist, sequela: Secondary | ICD-10-CM

## 2023-09-27 DIAGNOSIS — G8929 Other chronic pain: Secondary | ICD-10-CM

## 2023-09-27 DIAGNOSIS — M47816 Spondylosis without myelopathy or radiculopathy, lumbar region: Secondary | ICD-10-CM

## 2023-09-27 DIAGNOSIS — Z9889 Other specified postprocedural states: Secondary | ICD-10-CM

## 2023-09-27 DIAGNOSIS — M791 Myalgia, unspecified site: Secondary | ICD-10-CM

## 2023-09-27 DIAGNOSIS — I10 Essential (primary) hypertension: Secondary | ICD-10-CM

## 2023-09-27 DIAGNOSIS — Z96652 Presence of left artificial knee joint: Secondary | ICD-10-CM

## 2023-09-27 DIAGNOSIS — Z87891 Personal history of nicotine dependence: Secondary | ICD-10-CM

## 2023-09-27 DIAGNOSIS — E559 Vitamin D deficiency, unspecified: Secondary | ICD-10-CM

## 2023-09-27 DIAGNOSIS — R768 Other specified abnormal immunological findings in serum: Secondary | ICD-10-CM

## 2023-09-27 DIAGNOSIS — M5030 Other cervical disc degeneration, unspecified cervical region: Secondary | ICD-10-CM

## 2023-09-27 DIAGNOSIS — M19071 Primary osteoarthritis, right ankle and foot: Secondary | ICD-10-CM

## 2023-09-27 DIAGNOSIS — G5603 Carpal tunnel syndrome, bilateral upper limbs: Secondary | ICD-10-CM

## 2023-09-27 DIAGNOSIS — M79641 Pain in right hand: Secondary | ICD-10-CM

## 2023-09-27 DIAGNOSIS — M5481 Occipital neuralgia: Secondary | ICD-10-CM
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
## 2023-10-10 ENCOUNTER — Ambulatory Visit: Admitting: Rheumatology

## 2023-10-10 DIAGNOSIS — M5030 Other cervical disc degeneration, unspecified cervical region: Secondary | ICD-10-CM

## 2023-10-10 DIAGNOSIS — M5481 Occipital neuralgia: Secondary | ICD-10-CM

## 2023-10-10 DIAGNOSIS — Z9889 Other specified postprocedural states: Secondary | ICD-10-CM

## 2023-10-10 DIAGNOSIS — F5101 Primary insomnia: Secondary | ICD-10-CM

## 2023-10-10 DIAGNOSIS — M79641 Pain in right hand: Secondary | ICD-10-CM

## 2023-10-10 DIAGNOSIS — M059 Rheumatoid arthritis with rheumatoid factor, unspecified: Secondary | ICD-10-CM

## 2023-10-10 DIAGNOSIS — R768 Other specified abnormal immunological findings in serum: Secondary | ICD-10-CM

## 2023-10-10 DIAGNOSIS — M19071 Primary osteoarthritis, right ankle and foot: Secondary | ICD-10-CM

## 2023-10-10 DIAGNOSIS — G8929 Other chronic pain: Secondary | ICD-10-CM

## 2023-10-10 DIAGNOSIS — S63592S Other specified sprain of left wrist, sequela: Secondary | ICD-10-CM

## 2023-10-10 DIAGNOSIS — Z79899 Other long term (current) drug therapy: Secondary | ICD-10-CM

## 2023-10-10 DIAGNOSIS — Z9081 Acquired absence of spleen: Secondary | ICD-10-CM

## 2023-10-10 DIAGNOSIS — E559 Vitamin D deficiency, unspecified: Secondary | ICD-10-CM

## 2023-10-10 DIAGNOSIS — I10 Essential (primary) hypertension: Secondary | ICD-10-CM

## 2023-10-10 DIAGNOSIS — M791 Myalgia, unspecified site: Secondary | ICD-10-CM

## 2023-10-10 DIAGNOSIS — Z96652 Presence of left artificial knee joint: Secondary | ICD-10-CM

## 2023-10-10 DIAGNOSIS — M47816 Spondylosis without myelopathy or radiculopathy, lumbar region: Secondary | ICD-10-CM

## 2023-10-10 DIAGNOSIS — Z87891 Personal history of nicotine dependence: Secondary | ICD-10-CM

## 2023-10-10 DIAGNOSIS — M25511 Pain in right shoulder: Secondary | ICD-10-CM

## 2023-10-10 DIAGNOSIS — G5603 Carpal tunnel syndrome, bilateral upper limbs: Secondary | ICD-10-CM

## 2023-10-27 ENCOUNTER — Telehealth: Admitting: Family Medicine

## 2023-10-27 DIAGNOSIS — M545 Low back pain, unspecified: Secondary | ICD-10-CM

## 2023-10-27 MED ORDER — BACLOFEN 10 MG PO TABS: 10.0000 mg | ORAL_TABLET | Freq: Three times a day (TID) | ORAL | 0 refills | Status: AC

## 2023-10-27 NOTE — Progress Notes (Signed)
 Virtual Visit Consent   Halo Bitting, you are scheduled for a virtual visit with a [facility] provider today. Just as with appointments in the office, your consent must be obtained to participate. Your consent will be active for this visit and any virtual visit you may have with one of our providers in the next 365 days. If you have a MyChart account, a copy of this consent can be sent to you electronically.  As this is a virtual visit, video technology does not allow for your provider to perform a traditional examination. This may limit your provider's ability to fully assess your condition. If your provider identifies any concerns that need to be evaluated in person or the need to arrange testing (such as labs, EKG, etc.), we will make arrangements to do so. Although advances in technology are sophisticated, we cannot ensure that it will always work on either your end or our end. If the connection with a video visit is poor, the visit may have to be switched to a telephone visit. With either a video or telephone visit, we are not always able to ensure that we have a secure connection.  By engaging in this virtual visit, you consent to the provision of healthcare and authorize for your insurance to be billed (if applicable) for the services provided during this visit. Depending on your insurance coverage, you may receive a charge related to this service.  I need to obtain your verbal consent now. Are you willing to proceed with your visit today? Joan Pearson has provided verbal consent on 10/27/2023 for a virtual visit (video or telephone). Albertha Huger, FNP  Date: 10/27/2023 4:39 PM   Virtual Visit via Video Note   I, Albertha Huger, connected with  Joan Pearson  ([id number], [birth year]) on 10/27/23 at  4:45 PM EDT by a video-enabled telemedicine application and verified that I am speaking with the correct person using two identifiers.  Location: Patient: Virtual Visit Location Patient: Home Provider:  Virtual Visit Location Provider: Home Office   I discussed the limitations of evaluation and management by telemedicine and the availability of in person appointments. The patient expressed understanding and agreed to proceed.    History of Present Illness: Joan Pearson is a 58 y.o. who identifies as a female who was assigned female at birth, and is being seen today for low back pain after stretching. She is on celebrex . No radicular pain. Requesting something to help with pain. Aaron Aas  HPI: HPI  Problems:  Patient Active Problem List   Diagnosis Date Noted   Screening for cancer 04/05/2023   Cyclic citrullinated peptide (CCP) antibody positive 04/05/2023   Vaccine counseling 04/05/2023   Morning stiffness of joints 04/05/2023   Essential hypertension, benign 03/20/2023   Other chronic pain 03/20/2023   Muscle spasm 03/20/2023   Polyarthralgia 03/20/2023   Myalgia 03/20/2023   S/P hysterectomy 03/20/2023   Screen for colon cancer 03/20/2023   Encounter for screening mammogram for malignant neoplasm of breast 03/20/2023   Insomnia 03/20/2023   Vitamin D  deficiency 03/20/2023    Allergies:  Allergies  Allergen Reactions   Atorvastatin Other (See Comments)    muscle cramps, bone pain   Medications:  Current Outpatient Medications:    amLODipine  (NORVASC ) 5 MG tablet, TAKE 1 TABLET(5 MG) BY MOUTH DAILY, Disp: 90 tablet, Rfl: 2   celecoxib  (CELEBREX ) 100 MG capsule, TAKE 1 CAPSULE(100 MG) BY MOUTH TWICE DAILY, Disp: 60 capsule, Rfl: 0   fluticasone  (FLONASE ) 50 MCG/ACT nasal spray,  Place 2 sprays into both nostrils daily. (Patient taking differently: Place 2 sprays into both nostrils as needed.), Disp: 16 g, Rfl: 6   HYDROcodone -acetaminophen  (NORCO) 5-325 MG tablet, Take 1 tablet by mouth 2 (two) times daily as needed., Disp: 15 tablet, Rfl: 0   hydroxychloroquine  (PLAQUENIL ) 200 MG tablet, TAKE 1 TABLET(200 MG) BY MOUTH TWICE DAILY, Disp: 60 tablet, Rfl: 2   Multiple Vitamin  (MULTIVITAMIN) capsule, Take 1 capsule by mouth daily., Disp: , Rfl:    pregabalin  (LYRICA ) 100 MG capsule, Take 1 capsule (100 mg total) by mouth 2 (two) times daily., Disp: 60 capsule, Rfl: 1   vitamin B-12 (CYANOCOBALAMIN ) 1000 MCG tablet, Take 1,000 mcg by mouth daily., Disp: , Rfl:    Vitamin D , Ergocalciferol , (DRISDOL ) 1.25 MG (50000 UNIT) CAPS capsule, Take 1 capsule (50,000 Units total) by mouth every 7 (seven) days., Disp: 12 capsule, Rfl: 1  Observations/Objective: Patient is well-developed, well-nourished in no acute distress.  Resting comfortably  at home.  Head is normocephalic, atraumatic.  No labored breathing.  Speech is clear and coherent with logical content.  Patient is alert and oriented at baseline.    Assessment and Plan: There are no diagnoses linked to this encounter. Heat, UC if sx worsen.   Follow Up Instructions: I discussed the assessment and treatment plan with the patient. The patient was provided an opportunity to ask questions and all were answered. The patient agreed with the plan and demonstrated an understanding of the instructions.  A copy of instructions were sent to the patient via MyChart unless otherwise noted below.     The patient was advised to call back or seek an in-person evaluation if the symptoms worsen or if the condition fails to improve as anticipated.    Windsor Zirkelbach, FNP

## 2023-10-27 NOTE — Patient Instructions (Signed)
 Acute Back Pain, Adult Acute back pain is sudden and usually short-lived. It is often caused by an injury to the muscles and tissues in the back. The injury may result from: A muscle, tendon, or ligament getting overstretched or torn. Ligaments are tissues that connect bones to each other. Lifting something improperly can cause a back strain. Wear and tear (degeneration) of the spinal disks. Spinal disks are circular tissue that provide cushioning between the bones of the spine (vertebrae). Twisting motions, such as while playing sports or doing yard work. A hit to the back. Arthritis. You may have a physical exam, lab tests, and imaging tests to find the cause of your pain. Acute back pain usually goes away with rest and home care. Follow these instructions at home: Managing pain, stiffness, and swelling Take over-the-counter and prescription medicines only as told by your health care provider. Treatment may include medicines for pain and inflammation that are taken by mouth or applied to the skin, or muscle relaxants. Your health care provider may recommend applying ice during the first 24-48 hours after your pain starts. To do this: Put ice in a plastic bag. Place a towel between your skin and the bag. Leave the ice on for 20 minutes, 2-3 times a day. Remove the ice if your skin turns bright red. This is very important. If you cannot feel pain, heat, or cold, you have a greater risk of damage to the area. If directed, apply heat to the affected area as often as told by your health care provider. Use the heat source that your health care provider recommends, such as a moist heat pack or a heating pad. Place a towel between your skin and the heat source. Leave the heat on for 20-30 minutes. Remove the heat if your skin turns bright red. This is especially important if you are unable to feel pain, heat, or cold. You have a greater risk of getting burned. Activity  Do not stay in bed. Staying in  bed for more than 1-2 days can delay your recovery. Sit up and stand up straight. Avoid leaning forward when you sit or hunching over when you stand. If you work at a desk, sit close to it so you do not need to lean over. Keep your chin tucked in. Keep your neck drawn back, and keep your elbows bent at a 90-degree angle (right angle). Sit high and close to the steering wheel when you drive. Add lower back (lumbar) support to your car seat, if needed. Take short walks on even surfaces as soon as you are able. Try to increase the length of time you walk each day. Do not sit, drive, or stand in one place for more than 30 minutes at a time. Sitting or standing for long periods of time can put stress on your back. Do not drive or use heavy machinery while taking prescription pain medicine. Use proper lifting techniques. When you bend and lift, use positions that put less stress on your back: [place] your knees. Keep the load close to your body. Avoid twisting. Exercise regularly as told by your health care provider. Exercising helps your back heal faster and helps prevent back injuries by keeping muscles strong and flexible. Work with a physical therapist to make a safe exercise program, as recommended by your health care provider. Do any exercises as told by your physical therapist. Lifestyle Maintain a healthy weight. Extra weight puts stress on your back and makes it difficult to have good  posture. Avoid activities or situations that make you feel anxious or stressed. Stress and anxiety increase muscle tension and can make back pain worse. Learn ways to manage anxiety and stress, such as through exercise. General instructions Sleep on a firm mattress in a comfortable position. Try lying on your side with your knees slightly bent. If you lie on your back, put a pillow under your knees. Keep your head and neck in a straight line with your spine (neutral position) when using electronic equipment like  smartphones or pads. To do this: Raise your smartphone or pad to look at it instead of bending your head or neck to look down. Put the smartphone or pad at the level of your face while looking at the screen. Follow your treatment plan as told by your health care provider. This may include: Cognitive or behavioral therapy. Acupuncture or massage therapy. Meditation or yoga. Contact a health care provider if: You have pain that is not relieved with rest or medicine. You have increasing pain going down into your legs or buttocks. Your pain does not improve after 2 weeks. You have pain at night. You lose weight without trying. You have a fever or chills. You develop nausea or vomiting. You develop abdominal pain. Get help right away if: You develop new bowel or bladder control problems. You have unusual weakness or numbness in your arms or legs. You feel faint. These symptoms may represent a serious problem that is an emergency. Do not wait to see if the symptoms will go away. Get medical help right away. Call your local emergency services (911 in the [place] Do not drive yourself to the hospital. Summary Acute back pain is sudden and usually short-lived. Use proper lifting techniques. When you bend and lift, use positions that put less stress on your back. Take over-the-counter and prescription medicines only as told by your health care provider, and apply heat or ice as told. This information is not intended to replace advice given to you by your health care provider. Make sure you discuss any questions you have with your health care provider. Document Revised: 09/04/2020 Document Reviewed: 09/04/2020 Elsevier Patient Education  2024 [employer].

## 2023-11-02 ENCOUNTER — Ambulatory Visit: Admitting: Medical
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
## 2023-11-03 ENCOUNTER — Telehealth: Admitting: Medical

## 2023-11-03 ENCOUNTER — Ambulatory Visit (INDEPENDENT_AMBULATORY_CARE_PROVIDER_SITE_OTHER): Admitting: Medical

## 2023-11-03 VITALS — BP 148/90 | HR 62 | Wt 170.8 lb

## 2023-11-03 DIAGNOSIS — G8929 Other chronic pain: Secondary | ICD-10-CM | POA: Diagnosis not present

## 2023-11-03 DIAGNOSIS — E559 Vitamin D deficiency, unspecified: Secondary | ICD-10-CM | POA: Diagnosis not present

## 2023-11-03 DIAGNOSIS — M791 Myalgia, unspecified site: Secondary | ICD-10-CM

## 2023-11-03 DIAGNOSIS — M62838 Other muscle spasm: Secondary | ICD-10-CM

## 2023-11-03 DIAGNOSIS — M2550 Pain in unspecified joint: Secondary | ICD-10-CM

## 2023-11-03 DIAGNOSIS — I10 Essential (primary) hypertension: Secondary | ICD-10-CM | POA: Diagnosis not present

## 2023-11-03 DIAGNOSIS — M2560 Stiffness of unspecified joint, not elsewhere classified: Secondary | ICD-10-CM | POA: Diagnosis not present

## 2023-11-03 MED ORDER — AMLODIPINE-OLMESARTAN 5-20 MG PO TABS: 1.0000 | ORAL_TABLET | Freq: Every day | ORAL | 2 refills | Status: DC
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
## 2023-11-04 LAB — COMPREHENSIVE METABOLIC PANEL WITH GFR
ALT: 14 IU/L (ref 0–32)
AST: 22 IU/L (ref 0–40)
Albumin: 4.6 g/dL (ref 3.8–4.9)
Alkaline Phosphatase: 88 IU/L (ref 44–121)
BUN/Creatinine Ratio: 19 (ref 9–23)
BUN: 15 mg/dL (ref 6–24)
Bilirubin Total: 0.5 mg/dL (ref 0.0–1.2)
CO2: 23 mmol/L (ref 20–29)
Calcium: 9.7 mg/dL (ref 8.7–10.2)
Chloride: 103 mmol/L (ref 96–106)
Creatinine, Ser: 0.8 mg/dL (ref 0.57–1.00)
Globulin, Total: 2.6 g/dL (ref 1.5–4.5)
Glucose: 93 mg/dL (ref 70–99)
Potassium: 4.3 mmol/L (ref 3.5–5.2)
Sodium: 142 mmol/L (ref 134–144)
Total Protein: 7.2 g/dL (ref 6.0–8.5)
eGFR: 86 mL/min/{1.73_m2} (ref >59)

## 2023-11-04 LAB — CBC
Hematocrit: 37.1 % (ref 34.0–46.6)
Hemoglobin: 11.9 g/dL (ref 11.1–15.9)
MCH: 27.2 pg (ref 26.6–33.0)
MCHC: 32.1 g/dL (ref 31.5–35.7)
MCV: 85 fL (ref 79–97)
Platelets: 250 10*3/uL (ref 150–450)
RBC: 4.37 x10E6/uL (ref 3.77–5.28)
RDW: 12.7 % (ref 11.7–15.4)
WBC: 8.2 10*3/uL (ref 3.4–10.8)

## 2023-11-04 LAB — VITAMIN D 25 HYDROXY (VIT D DEFICIENCY, FRACTURES): Vit D, 25-Hydroxy: 17.1 ng/mL — ABNORMAL LOW (ref 30.0–100.0)

## 2023-11-06 ENCOUNTER — Other Ambulatory Visit: Payer: Self-pay | Admitting: Medical

## 2023-11-06 MED ORDER — VITAMIN D (ERGOCALCIFEROL) 1.25 MG (50000 UNIT) PO CAPS: 50000.0000 [IU] | ORAL_CAPSULE | ORAL | 1 refills | Status: DC

## 2023-11-06 NOTE — Progress Notes (Signed)
 Joan Pearson, schedule I month follow-up on blood pressure  Results sent through MyChart

## 2023-11-07 ENCOUNTER — Telehealth: Admitting: Medical

## 2023-11-08 ENCOUNTER — Ambulatory Visit: Admitting: Rheumatology

## 2023-11-08 DIAGNOSIS — Z9081 Acquired absence of spleen: Secondary | ICD-10-CM

## 2023-11-08 DIAGNOSIS — M79641 Pain in right hand: Secondary | ICD-10-CM

## 2023-11-08 DIAGNOSIS — Z79899 Other long term (current) drug therapy: Secondary | ICD-10-CM

## 2023-11-08 DIAGNOSIS — F5101 Primary insomnia: Secondary | ICD-10-CM

## 2023-11-08 DIAGNOSIS — M19072 Primary osteoarthritis, left ankle and foot: Secondary | ICD-10-CM

## 2023-11-08 DIAGNOSIS — Z96652 Presence of left artificial knee joint: Secondary | ICD-10-CM

## 2023-11-08 DIAGNOSIS — M5481 Occipital neuralgia: Secondary | ICD-10-CM

## 2023-11-08 DIAGNOSIS — S63592S Other specified sprain of left wrist, sequela: Secondary | ICD-10-CM

## 2023-11-08 DIAGNOSIS — M791 Myalgia, unspecified site: Secondary | ICD-10-CM

## 2023-11-08 DIAGNOSIS — M47816 Spondylosis without myelopathy or radiculopathy, lumbar region: Secondary | ICD-10-CM

## 2023-11-08 DIAGNOSIS — E559 Vitamin D deficiency, unspecified: Secondary | ICD-10-CM

## 2023-11-08 DIAGNOSIS — M25511 Pain in right shoulder: Secondary | ICD-10-CM

## 2023-11-08 DIAGNOSIS — Z87891 Personal history of nicotine dependence: Secondary | ICD-10-CM

## 2023-11-08 DIAGNOSIS — G5603 Carpal tunnel syndrome, bilateral upper limbs: Secondary | ICD-10-CM

## 2023-11-08 DIAGNOSIS — I10 Essential (primary) hypertension: Secondary | ICD-10-CM

## 2023-11-08 DIAGNOSIS — Z9889 Other specified postprocedural states: Secondary | ICD-10-CM

## 2023-11-08 DIAGNOSIS — G8929 Other chronic pain: Secondary | ICD-10-CM

## 2023-11-08 DIAGNOSIS — R768 Other specified abnormal immunological findings in serum: Secondary | ICD-10-CM

## 2023-11-08 DIAGNOSIS — M5030 Other cervical disc degeneration, unspecified cervical region: Secondary | ICD-10-CM

## 2023-11-08 DIAGNOSIS — M059 Rheumatoid arthritis with rheumatoid factor, unspecified: Secondary | ICD-10-CM

## 2023-11-26 ENCOUNTER — Telehealth: Admitting: Family

## 2023-11-26 ENCOUNTER — Telehealth: Admitting: Nurse Practitioner

## 2023-11-26 DIAGNOSIS — R079 Chest pain, unspecified: Secondary | ICD-10-CM | POA: Diagnosis not present

## 2023-11-26 DIAGNOSIS — R0781 Pleurodynia: Secondary | ICD-10-CM

## 2023-11-26 MED ORDER — LIDOCAINE 5 % EX PTCH
1.0000 | MEDICATED_PATCH | CUTANEOUS | 0 refills | Status: DC
Start: 2023-11-26 — End: 2024-05-01

## 2023-11-26 MED ORDER — METHOCARBAMOL 500 MG PO TABS
500.0000 mg | ORAL_TABLET | Freq: Three times a day (TID) | ORAL | 1 refills | Status: DC | PRN
Start: 2023-11-26 — End: 2024-03-15

## 2023-11-26 NOTE — Progress Notes (Signed)
 Virtual Visit Consent   Joan Pearson, you are scheduled for a virtual visit with a [facility] provider today. Just as with appointments in the office, your consent must be obtained to participate. Your consent will be active for this visit and any virtual visit you may have with one of our providers in the next 365 days. If you have a MyChart account, a copy of this consent can be sent to you electronically.  As this is a virtual visit, video technology does not allow for your provider to perform a traditional examination. This may limit your provider's ability to fully assess your condition. If your provider identifies any concerns that need to be evaluated in person or the need to arrange testing (such as labs, EKG, etc.), we will make arrangements to do so. Although advances in technology are sophisticated, we cannot ensure that it will always work on either your end or our end. If the connection with a video visit is poor, the visit may have to be switched to a telephone visit. With either a video or telephone visit, we are not always able to ensure that we have a secure connection.  By engaging in this virtual visit, you consent to the provision of healthcare and authorize for your insurance to be billed (if applicable) for the services provided during this visit. Depending on your insurance coverage, you may receive a charge related to this service.  I need to obtain your verbal consent now. Are you willing to proceed with your visit today? Joan Pearson has provided verbal consent on 11/26/2023 for a virtual visit (video or telephone). Joan Dean, NP  Date: 11/26/2023 2:21 PM   Virtual Visit via Video Note   I, Joan Pearson, connected with  Joan Pearson  ([id number], 09-05-1965) on 11/26/23 at  2:15 PM EDT by a video-enabled telemedicine application and verified that I am speaking with the correct person using two identifiers.  Location: Patient: Virtual Visit Location Patient:  Home Provider: Virtual Visit Location Provider: Home Office   I discussed the limitations of evaluation and management by telemedicine and the availability of in person appointments. The patient expressed understanding and agreed to proceed.    History of Present Illness: Joan Pearson is a 58 y.o. who identifies as a female who was assigned female at birth, and is being seen today for right sided upper chest pain. .  Feels like she pulled a muscle. Pain started this morning. She denies shortness of breath. She is currently taking lyrica . States she has a "diagnosis" and has tried meloxicam , lidocaine  patch, hydrocodone , celebrex , muscle relaxants with no improvement of pain in other areas of her body.   Problems:  Patient Active Problem List   Diagnosis Date Noted   Screening for cancer 04/05/2023   Cyclic citrullinated peptide (CCP) antibody positive 04/05/2023   Vaccine counseling 04/05/2023   Morning stiffness of joints 04/05/2023   Essential hypertension, benign 03/20/2023   Other chronic pain 03/20/2023   Muscle spasm 03/20/2023   Polyarthralgia 03/20/2023   Myalgia 03/20/2023   S/P hysterectomy 03/20/2023   Screen for colon cancer 03/20/2023   Encounter for screening mammogram for malignant neoplasm of breast 03/20/2023   Insomnia 03/20/2023   Vitamin D  deficiency 03/20/2023    Allergies:  Allergies  Allergen Reactions   Atorvastatin Other (See Comments)    muscle cramps, bone pain   Medications:  Current Outpatient Medications:    lidocaine  (LIDODERM ) 5 %, Place 1 patch onto the skin daily.  Remove & Discard patch within 12 hours or as directed by MD, Disp: 30 patch, Rfl: 0   amLODipine  (NORVASC ) 5 MG tablet, TAKE 1 TABLET(5 MG) BY MOUTH DAILY, Disp: 90 tablet, Rfl: 2   amLODipine -olmesartan  (AZOR ) 5-20 MG tablet, Take 1 tablet by mouth daily., Disp: 30 tablet, Rfl: 2   celecoxib  (CELEBREX ) 100 MG capsule, TAKE 1 CAPSULE(100 MG) BY MOUTH TWICE DAILY, Disp: 60 capsule,  Rfl: 0   fluticasone  (FLONASE ) 50 MCG/ACT nasal spray, Place 2 sprays into both nostrils daily. (Patient taking differently: Place 2 sprays into both nostrils as needed.), Disp: 16 g, Rfl: 6   HYDROcodone -acetaminophen  (NORCO) 5-325 MG tablet, Take 1 tablet by mouth 2 (two) times daily as needed. (Patient not taking: Reported on 11/03/2023), Disp: 15 tablet, Rfl: 0   hydroxychloroquine  (PLAQUENIL ) 200 MG tablet, TAKE 1 TABLET(200 MG) BY MOUTH TWICE DAILY, Disp: 60 tablet, Rfl: 2   Multiple Vitamin (MULTIVITAMIN) capsule, Take 1 capsule by mouth daily., Disp: , Rfl:    pregabalin  (LYRICA ) 100 MG capsule, Take 1 capsule (100 mg total) by mouth 2 (two) times daily., Disp: 60 capsule, Rfl: 1   vitamin B-12 (CYANOCOBALAMIN ) 1000 MCG tablet, Take 1,000 mcg by mouth daily. (Patient not taking: Reported on 11/03/2023), Disp: , Rfl:    Vitamin D , Ergocalciferol , (DRISDOL ) 1.25 MG (50000 UNIT) CAPS capsule, Take 1 capsule (50,000 Units total) by mouth every 7 (seven) days., Disp: 12 capsule, Rfl: 1  Observations/Objective: Patient is well-developed, well-nourished in no acute distress.  Resting comfortably at home.  Head is normocephalic, atraumatic.  No labored breathing.  Speech is clear and coherent with logical content.  Patient is alert and oriented at baseline.    Assessment and Plan: 1. Right-sided chest pain (Primary) - lidocaine  (LIDODERM ) 5 %; Place 1 patch onto the skin daily. Remove & Discard patch within 12 hours or as directed by MD  Dispense: 30 patch; Refill: 0  Follow up with PCP. It appears this has been an ongoing concern based on her office visits and was already being addressed  Follow Up Instructions: I discussed the assessment and treatment plan with the patient. The patient was provided an opportunity to ask questions and all were answered. The patient agreed with the plan and demonstrated an understanding of the instructions.  A copy of instructions were sent to the patient via  MyChart unless otherwise noted below.    The patient was advised to call back or seek an in-person evaluation if the symptoms worsen or if the condition fails to improve as anticipated.    Dawnelle Warman W Pete Merten, NP

## 2023-11-26 NOTE — Progress Notes (Signed)
 Virtual Visit Consent   Joan Pearson, you are scheduled for a virtual visit with a  provider today. Just as with appointments in the office, your consent must be obtained to participate. Your consent will be active for this visit and any virtual visit you may have with one of our providers in the next 365 days. If you have a MyChart account, a copy of this consent can be sent to you electronically.  As this is a virtual visit, video technology does not allow for your provider to perform a traditional examination. This may limit your provider's ability to fully assess your condition. If your provider identifies any concerns that need to be evaluated in person or the need to arrange testing (such as labs, EKG, etc.), we will make arrangements to do so. Although advances in technology are sophisticated, we cannot ensure that it will always work on either your end or our end. If the connection with a video visit is poor, the visit may have to be switched to a telephone visit. With either a video or telephone visit, we are not always able to ensure that we have a secure connection.  By engaging in this virtual visit, you consent to the provision of healthcare and authorize for your insurance to be billed (if applicable) for the services provided during this visit. Depending on your insurance coverage, you may receive a charge related to this service.  I need to obtain your verbal consent now. Are you willing to proceed with your visit today? Joan Pearson has provided verbal consent on 11/26/2023 for a virtual visit (video or telephone). Tommas Fragmin, FNP  Date: 11/26/2023 6:39 PM   Virtual Visit via Video Note   I, Tommas Fragmin, connected with  Joan Pearson  ([id number], Jun 05, 1966) on 11/26/23 at  6:30 PM EDT by a video-enabled telemedicine application and verified that I am speaking with the correct person using two identifiers.  Location: Patient: Virtual Visit Location Patient:  Home Provider: Virtual Visit Location Provider: Home Office   I discussed the limitations of evaluation and management by telemedicine and the availability of in person appointments. The patient expressed understanding and agreed to proceed.    History of Present Illness: Joan Pearson is a 58 y.o. who identifies as a female who was assigned female at birth, and is being seen today right chest pain that has been on going for several months on and off.   Has tried Celebrex , Norco, lidocaine , Lyrica , and baclofen  without relief.   HPI: HPI  Problems:  Patient Active Problem List   Diagnosis Date Noted   Screening for cancer 04/05/2023   Cyclic citrullinated peptide (CCP) antibody positive 04/05/2023   Vaccine counseling 04/05/2023   Morning stiffness of joints 04/05/2023   Essential hypertension, benign 03/20/2023   Other chronic pain 03/20/2023   Muscle spasm 03/20/2023   Polyarthralgia 03/20/2023   Myalgia 03/20/2023   S/P hysterectomy 03/20/2023   Screen for colon cancer 03/20/2023   Encounter for screening mammogram for malignant neoplasm of breast 03/20/2023   Insomnia 03/20/2023   Vitamin D  deficiency 03/20/2023    Allergies:  Allergies  Allergen Reactions   Atorvastatin Other (See Comments)    muscle cramps, bone pain   Medications:  Current Outpatient Medications:    methocarbamol (ROBAXIN) 500 MG tablet, Take 1 tablet (500 mg total) by mouth every 8 (eight) hours as needed for muscle spasms., Disp: 60 tablet, Rfl: 1   amLODipine  (NORVASC ) 5 MG tablet, TAKE 1 TABLET(5 MG)  BY MOUTH DAILY, Disp: 90 tablet, Rfl: 2   amLODipine -olmesartan  (AZOR ) 5-20 MG tablet, Take 1 tablet by mouth daily., Disp: 30 tablet, Rfl: 2   celecoxib  (CELEBREX ) 100 MG capsule, TAKE 1 CAPSULE(100 MG) BY MOUTH TWICE DAILY, Disp: 60 capsule, Rfl: 0   fluticasone  (FLONASE ) 50 MCG/ACT nasal spray, Place 2 sprays into both nostrils daily. (Patient taking differently: Place 2 sprays into both nostrils as  needed.), Disp: 16 g, Rfl: 6   HYDROcodone -acetaminophen  (NORCO) 5-325 MG tablet, Take 1 tablet by mouth 2 (two) times daily as needed. (Patient not taking: Reported on 11/03/2023), Disp: 15 tablet, Rfl: 0   hydroxychloroquine  (PLAQUENIL ) 200 MG tablet, TAKE 1 TABLET(200 MG) BY MOUTH TWICE DAILY, Disp: 60 tablet, Rfl: 2   lidocaine  (LIDODERM ) 5 %, Place 1 patch onto the skin daily. Remove & Discard patch within 12 hours or as directed by MD, Disp: 30 patch, Rfl: 0   Multiple Vitamin (MULTIVITAMIN) capsule, Take 1 capsule by mouth daily., Disp: , Rfl:    pregabalin  (LYRICA ) 100 MG capsule, Take 1 capsule (100 mg total) by mouth 2 (two) times daily., Disp: 60 capsule, Rfl: 1   vitamin B-12 (CYANOCOBALAMIN ) 1000 MCG tablet, Take 1,000 mcg by mouth daily. (Patient not taking: Reported on 11/03/2023), Disp: , Rfl:    Vitamin D , Ergocalciferol , (DRISDOL ) 1.25 MG (50000 UNIT) CAPS capsule, Take 1 capsule (50,000 Units total) by mouth every 7 (seven) days., Disp: 12 capsule, Rfl: 1  Observations/Objective: Patient is well-developed, well-nourished in no acute distress.  Resting comfortably  at home.  Head is normocephalic, atraumatic.  No labored breathing.  Speech is clear and coherent with logical content.  Patient is alert and oriented at baseline.  Full ROM, pain in right chest   Assessment and Plan: 1. Rib pain (Primary) - methocarbamol (ROBAXIN) 500 MG tablet; Take 1 tablet (500 mg total) by mouth every 8 (eight) hours as needed for muscle spasms.  Dispense: 60 tablet; Refill: 1  Continue  Celebrex , Norco, lidocaine , Lyrica  Tylenol  as needed Robaxin as needed Recommend follow up with PCP, needs follow up with PT  Follow Up Instructions: I discussed the assessment and treatment plan with the patient. The patient was provided an opportunity to ask questions and all were answered. The patient agreed with the plan and demonstrated an understanding of the instructions.  A copy of instructions were  sent to the patient via MyChart unless otherwise noted below.     The patient was advised to call back or seek an in-person evaluation if the symptoms worsen or if the condition fails to improve as anticipated.    Tommas Fragmin, FNP

## 2023-11-26 NOTE — Patient Instructions (Signed)
 Joan Pearson, thank you for joining Collins Dean, NP for today's virtual visit.  While this provider is not your primary care provider (PCP), if your PCP is located in our provider database this encounter information will be shared with them immediately following your visit.   A [facility] MyChart account gives you access to today's visit and all your visits, tests, and labs performed at [facility] " click here if you don't have a [facility] MyChart account or go to [email or web address]  Consent: (Patient) Joan Pearson provided verbal consent for this virtual visit at the beginning of the encounter.  Current Medications:  Current Outpatient Medications:    lidocaine  (LIDODERM ) 5 %, Place 1 patch onto the skin daily. Remove & Discard patch within 12 hours or as directed by MD, Disp: 30 patch, Rfl: 0   amLODipine  (NORVASC ) 5 MG tablet, TAKE 1 TABLET(5 MG) BY MOUTH DAILY, Disp: 90 tablet, Rfl: 2   amLODipine -olmesartan  (AZOR ) 5-20 MG tablet, Take 1 tablet by mouth daily., Disp: 30 tablet, Rfl: 2   celecoxib  (CELEBREX ) 100 MG capsule, TAKE 1 CAPSULE(100 MG) BY MOUTH TWICE DAILY, Disp: 60 capsule, Rfl: 0   fluticasone  (FLONASE ) 50 MCG/ACT nasal spray, Place 2 sprays into both nostrils daily. (Patient taking differently: Place 2 sprays into both nostrils as needed.), Disp: 16 g, Rfl: 6   HYDROcodone -acetaminophen  (NORCO) 5-325 MG tablet, Take 1 tablet by mouth 2 (two) times daily as needed. (Patient not taking: Reported on 11/03/2023), Disp: 15 tablet, Rfl: 0   hydroxychloroquine  (PLAQUENIL ) 200 MG tablet, TAKE 1 TABLET(200 MG) BY MOUTH TWICE DAILY, Disp: 60 tablet, Rfl: 2   Multiple Vitamin (MULTIVITAMIN) capsule, Take 1 capsule by mouth daily., Disp: , Rfl:    pregabalin  (LYRICA ) 100 MG capsule, Take 1 capsule (100 mg total) by mouth 2 (two) times daily., Disp: 60 capsule, Rfl: 1   vitamin B-12 (CYANOCOBALAMIN ) 1000 MCG tablet, Take 1,000 mcg by mouth daily. (Patient not taking:  Reported on 11/03/2023), Disp: , Rfl:    Vitamin D , Ergocalciferol , (DRISDOL ) 1.25 MG (50000 UNIT) CAPS capsule, Take 1 capsule (50,000 Units total) by mouth every 7 (seven) days., Disp: 12 capsule, Rfl: 1   Medications ordered in this encounter:  Meds ordered this encounter  Medications   lidocaine  (LIDODERM ) 5 %    Sig: Place 1 patch onto the skin daily. Remove & Discard patch within 12 hours or as directed by MD    Dispense:  30 patch    Refill:  0    Supervising Provider:   Corine Dice [phone]     *If you need refills on other medications prior to your next appointment, please contact your pharmacy*  Follow-Up: Call back or seek an in-person evaluation if the symptoms worsen or if the condition fails to improve as anticipated.  [facility] [phone]  Other Instructions Follow up with PCP. It appears this has been an ongoing concern based on her office visits and was already being addressed   If you have been instructed to have an in-person evaluation today at a local Urgent Care facility, please use the link below. It will take you to a list of all of our available [facility], including address, phone number and hours of operation. Please do not delay care.  [facility]  If you or a family member do not have a primary care provider, use the link below to schedule a visit and establish care. When you choose  a [facility] primary care physician or advanced practice provider, you gain a long-term partner in health. Find a Primary Care Provider  Learn more about [facility]'s in-office and virtual care options: [facility] - Get Care Now

## 2023-11-27 ENCOUNTER — Telehealth: Payer: Self-pay

## 2023-11-27 NOTE — Telephone Encounter (Signed)
 Pharmacy Patient Advocate Encounter   Received notification from CoverMyMeds that prior authorization for LIDOCAINE  PATCH is required/requested.   Insurance verification completed.   The patient is insured through [facility] .   Per test claim: PA required; PA submitted to above mentioned insurance via CoverMyMeds Key/confirmation #/[id number] [facility] Status is pending

## 2023-11-29 ENCOUNTER — Other Ambulatory Visit: Payer: Self-pay

## 2023-12-05 ENCOUNTER — Ambulatory Visit: Admitting: Medical

## 2023-12-06 ENCOUNTER — Encounter: Payer: Self-pay | Admitting: Medical

## 2024-01-09 ENCOUNTER — Ambulatory Visit: Attending: Medical | Admitting: Physical Therapy

## 2024-01-09 ENCOUNTER — Encounter: Payer: Self-pay | Admitting: Physical Therapy

## 2024-01-09 DIAGNOSIS — M5459 Other low back pain: Secondary | ICD-10-CM | POA: Insufficient documentation

## 2024-01-09 DIAGNOSIS — M791 Myalgia, unspecified site: Secondary | ICD-10-CM | POA: Insufficient documentation

## 2024-01-09 DIAGNOSIS — R293 Abnormal posture: Secondary | ICD-10-CM | POA: Insufficient documentation

## 2024-01-09 DIAGNOSIS — G8929 Other chronic pain: Secondary | ICD-10-CM | POA: Diagnosis not present

## 2024-01-09 DIAGNOSIS — M62838 Other muscle spasm: Secondary | ICD-10-CM | POA: Insufficient documentation
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
## 2024-01-24 ENCOUNTER — Ambulatory Visit: Admitting: Physical Therapy
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
## 2024-01-26 ENCOUNTER — Ambulatory Visit: Attending: Medical

## 2024-01-26 ENCOUNTER — Telehealth: Payer: Self-pay

## 2024-01-26 NOTE — Telephone Encounter (Signed)
 LVM re: no show appt 01/26/24. Reminded pt of her upcoming appt and of the attendance policy.

## 2024-01-30 ENCOUNTER — Ambulatory Visit

## 2024-02-02 ENCOUNTER — Ambulatory Visit

## 2024-02-06 ENCOUNTER — Ambulatory Visit

## 2024-02-08 ENCOUNTER — Ambulatory Visit

## 2024-03-15 ENCOUNTER — Ambulatory Visit: Admitting: Medical

## 2024-03-15 VITALS — BP 110/80 | HR 58 | Temp 98.0°F | Wt 171.2 lb

## 2024-03-15 DIAGNOSIS — R5381 Other malaise: Secondary | ICD-10-CM

## 2024-03-15 DIAGNOSIS — E639 Nutritional deficiency, unspecified: Secondary | ICD-10-CM | POA: Diagnosis not present

## 2024-03-15 DIAGNOSIS — R110 Nausea: Secondary | ICD-10-CM | POA: Diagnosis not present

## 2024-03-15 DIAGNOSIS — M542 Cervicalgia: Secondary | ICD-10-CM

## 2024-03-15 DIAGNOSIS — M25552 Pain in left hip: Secondary | ICD-10-CM

## 2024-03-15 DIAGNOSIS — M5442 Lumbago with sciatica, left side: Secondary | ICD-10-CM | POA: Diagnosis not present

## 2024-03-15 DIAGNOSIS — R634 Abnormal weight loss: Secondary | ICD-10-CM

## 2024-03-15 DIAGNOSIS — R5383 Other fatigue: Secondary | ICD-10-CM | POA: Diagnosis not present

## 2024-03-15 LAB — POCT URINALYSIS DIP (PROADVANTAGE DEVICE)
Bilirubin, UA: NEGATIVE
Blood, UA: NEGATIVE
Glucose, UA: 100 mg/dL — AB
Ketones, POC UA: NEGATIVE mg/dL
Nitrite, UA: NEGATIVE
Protein Ur, POC: NEGATIVE mg/dL
Specific Gravity, Urine: 1.01
Urobilinogen, Ur: NEGATIVE
pH, UA: 6.5 (ref 5.0–8.0)

## 2024-03-15 MED ORDER — ONDANSETRON 4 MG PO TBDP
4.0000 mg | ORAL_TABLET | Freq: Three times a day (TID) | ORAL | 0 refills | Status: DC | PRN
Start: 2024-03-15 — End: 2024-05-01

## 2024-03-15 MED ORDER — VITAMIN D (ERGOCALCIFEROL) 1.25 MG (50000 UNIT) PO CAPS: 50000.0000 [IU] | ORAL_CAPSULE | ORAL | 1 refills | Status: AC

## 2024-03-15 MED ORDER — NAPROXEN 500 MG PO TABS: 500.0000 mg | ORAL_TABLET | Freq: Two times a day (BID) | ORAL | 0 refills | Status: DC

## 2024-03-15 MED ORDER — PREGABALIN 100 MG PO CAPS: 100.0000 mg | ORAL_CAPSULE | Freq: Two times a day (BID) | ORAL | 1 refills | Status: AC

## 2024-03-15 MED ORDER — TIZANIDINE HCL 4 MG PO TABS: 4.0000 mg | ORAL_TABLET | Freq: Every evening | ORAL | 0 refills | Status: AC | PRN

## 2024-03-15 MED ORDER — AMLODIPINE-OLMESARTAN 5-20 MG PO TABS: 1.0000 | ORAL_TABLET | Freq: Every day | ORAL | 1 refills | Status: AC
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
## 2024-03-15 NOTE — Patient Instructions (Addendum)
 Please go to [facility] for your  xrays.   Their hours are 8am - 4:30 pm Monday - Friday.  Take your insurance card with you.  [facility] [phone]   [address] [place], [place] [address]   Neck pain, history of neck surgery, history of mental arthritis Current stiffness and pain may be related to RA flare.  Range of motion on exam is mostly full and no obvious stiffness with her arm range of motion or neck range of motion   Rheumatoid arthritis with left sacroiliac and hip pain (possible sacroiliitis) Chronic rheumatoid arthritis with exacerbation of left sacroiliac and hip pain - Order x-ray of the low back and left hip  - Consider short-term use of anti-inflammatory medication or muscle relaxants for pain and be related to flareup be related to dietary changes or nutritional deficiencies. - Order blood work including thyroid  function tests to evaluate for potential causes of fatigue and nausea.  Hypertension Hypertension managed with amlodipine  olmesartan  5/20 mg daily. Blood pressure is well-controlled.  Weight loss Intentional weight loss from 183 to 171 pounds due to dietary changes. She is not concerned about weight loss as it is a result of conscious dietary choices.  Nausea-etiology unclear.   -Labs as below - Cautioned on marijuana use as this can contribute to nausea  Fatigue-could be multifactorial, could be RA related.  She does have a history of vitamin D  deficiency.  She also seems to have poor nutrition currently - Labs as below  Nutritional deficiency - I recommend eating 2-3 meals daily - Supplement with protein shakes such as Ensure as needed - Begin daily multivitamin - Continue vitamin D  supplement prescription - Need to be getting a variety of fruits, vegetables, some dairy such as yogurt and milk, nuts and beans and other nutrients in the diet  Prescription today for Zofran  as needed for nausea Prescription today for tizanidine   muscle relaxer in the evening or at bedtime as needed short-term for spasm and stiffness Prescription today for Naprosyn  to use once or twice a day for pain and inflammation short-term

## 2024-03-16 LAB — CK: Total CK: 119 U/L (ref 32–182)

## 2024-03-18 ENCOUNTER — Ambulatory Visit: Payer: Self-pay | Admitting: Medical

## 2024-03-18 NOTE — Progress Notes (Signed)
 Results thru my chart

## 2024-03-19 LAB — COMPREHENSIVE METABOLIC PANEL WITH GFR
ALT: 16 IU/L (ref 0–32)
AST: 19 IU/L (ref 0–40)
Albumin: 4.3 g/dL (ref 3.8–4.9)
Alkaline Phosphatase: 81 IU/L (ref 49–135)
BUN/Creatinine Ratio: 17 (ref 9–23)
BUN: 12 mg/dL (ref 6–24)
Bilirubin Total: 0.4 mg/dL (ref 0.0–1.2)
CO2: 22 mmol/L (ref 20–29)
Calcium: 9.2 mg/dL (ref 8.7–10.2)
Chloride: 105 mmol/L (ref 96–106)
Creatinine, Ser: 0.72 mg/dL (ref 0.57–1.00)
Globulin, Total: 2.5 g/dL (ref 1.5–4.5)
Glucose: 80 mg/dL (ref 70–99)
Potassium: 4.1 mmol/L (ref 3.5–5.2)
Sodium: 144 mmol/L (ref 134–144)
Total Protein: 6.8 g/dL (ref 6.0–8.5)
eGFR: 98 mL/min/1.73 (ref >59)

## 2024-03-19 LAB — CBC
Hematocrit: 36.9 % (ref 34.0–46.6)
Hemoglobin: 11.3 g/dL (ref 11.1–15.9)
MCH: 27.3 pg (ref 26.6–33.0)
MCHC: 30.6 g/dL — ABNORMAL LOW (ref 31.5–35.7)
MCV: 89 fL (ref 79–97)
Platelets: 198 x10E3/uL (ref 150–450)
RBC: 4.14 x10E6/uL (ref 3.77–5.28)
RDW: 13 % (ref 11.7–15.4)
WBC: 5 x10E3/uL (ref 3.4–10.8)

## 2024-03-19 LAB — TSH+FREE T4
Free T4: 1.23 ng/dL (ref 0.82–1.77)
TSH: 1.96 u[IU]/mL (ref 0.450–4.500)

## 2024-03-19 LAB — LIPASE: Lipase: 37 U/L (ref 14–72)

## 2024-03-19 LAB — CORTISOL: Cortisol: 18.7 ug/dL (ref 6.2–19.4)

## 2024-03-19 LAB — SEDIMENTATION RATE: Sed Rate: 7 mm/h (ref 0–40)

## 2024-03-19 NOTE — Progress Notes (Signed)
 Results through MyChart

## 2024-05-01 ENCOUNTER — Encounter: Payer: Self-pay | Admitting: Medical

## 2024-05-01 ENCOUNTER — Ambulatory Visit: Payer: Medicaid Other | Admitting: Medical

## 2024-05-01 VITALS — BP 130/80 | HR 64 | Ht 68.0 in | Wt 171.2 lb

## 2024-05-01 DIAGNOSIS — Z1231 Encounter for screening mammogram for malignant neoplasm of breast: Secondary | ICD-10-CM | POA: Diagnosis not present

## 2024-05-01 DIAGNOSIS — Z13220 Encounter for screening for lipoid disorders: Secondary | ICD-10-CM | POA: Diagnosis not present

## 2024-05-01 DIAGNOSIS — Z131 Encounter for screening for diabetes mellitus: Secondary | ICD-10-CM | POA: Diagnosis not present

## 2024-05-01 DIAGNOSIS — Z7185 Encounter for immunization safety counseling: Secondary | ICD-10-CM | POA: Diagnosis not present

## 2024-05-01 DIAGNOSIS — Z130 Encounter for screening for diseases of the blood and blood-forming organs and certain disorders involving the immune mechanism: Secondary | ICD-10-CM

## 2024-05-01 DIAGNOSIS — Z0000 Encounter for general adult medical examination without abnormal findings: Secondary | ICD-10-CM | POA: Diagnosis not present

## 2024-05-01 DIAGNOSIS — R6889 Other general symptoms and signs: Secondary | ICD-10-CM | POA: Diagnosis not present

## 2024-05-01 DIAGNOSIS — Z1389 Encounter for screening for other disorder: Secondary | ICD-10-CM

## 2024-05-01 DIAGNOSIS — Z90710 Acquired absence of both cervix and uterus: Secondary | ICD-10-CM

## 2024-05-01 DIAGNOSIS — Z136 Encounter for screening for cardiovascular disorders: Secondary | ICD-10-CM | POA: Diagnosis not present

## 2024-05-01 DIAGNOSIS — Z8249 Family history of ischemic heart disease and other diseases of the circulatory system: Secondary | ICD-10-CM | POA: Diagnosis not present
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
## 2024-05-02 ENCOUNTER — Ambulatory Visit: Payer: Self-pay | Admitting: Medical

## 2024-05-02 LAB — LIPID PANEL
Chol/HDL Ratio: 2.6 ratio (ref 0.0–4.4)
Cholesterol, Total: 196 mg/dL (ref 100–199)
HDL: 74 mg/dL (ref >39)
LDL Chol Calc (NIH): 111 mg/dL — ABNORMAL HIGH (ref 0–99)
Triglycerides: 58 mg/dL (ref 0–149)
VLDL Cholesterol Cal: 11 mg/dL (ref 5–40)

## 2024-05-02 LAB — URINALYSIS, ROUTINE W REFLEX MICROSCOPIC
Bilirubin, UA: NEGATIVE
Glucose, UA: NEGATIVE
Ketones, UA: NEGATIVE
Leukocytes,UA: NEGATIVE
Nitrite, UA: NEGATIVE
Protein,UA: NEGATIVE
RBC, UA: NEGATIVE
Specific Gravity, UA: 1.011 (ref 1.005–1.030)
Urobilinogen, Ur: 0.2 mg/dL (ref 0.2–1.0)
pH, UA: 7.5 (ref 5.0–7.5)

## 2024-05-02 LAB — HEMOGLOBIN A1C
Est. average glucose Bld gHb Est-mCnc: 105 mg/dL
Hgb A1c MFr Bld: 5.3 % (ref 4.8–5.6)

## 2024-05-02 LAB — HEPATITIS C ANTIBODY: Hep C Virus Ab: NONREACTIVE

## 2024-05-02 LAB — FIBRINOGEN: Fibrinogen: 337 mg/dL (ref 193–507)

## 2024-05-02 LAB — PT AND PTT
INR: 1 (ref 0.9–1.2)
Prothrombin Time: 10.4 s (ref 9.1–12.0)
aPTT: 28 s (ref 24–33)

## 2024-05-02 LAB — VITAMIN D 25 HYDROXY (VIT D DEFICIENCY, FRACTURES): Vit D, 25-Hydroxy: 22.3 ng/mL — AB (ref 30.0–100.0)

## 2024-05-02 LAB — HIV ANTIBODY (ROUTINE TESTING W REFLEX): HIV Screen 4th Generation wRfx: NONREACTIVE

## 2024-05-02 LAB — IRON: Iron: 78 ug/dL (ref 27–159)

## 2024-05-02 NOTE — Progress Notes (Signed)
 Results through MyChart

## 2024-05-07 DIAGNOSIS — M25569 Pain in unspecified knee: Secondary | ICD-10-CM

## 2024-05-28 ENCOUNTER — Telehealth: Payer: Self-pay

## 2024-05-28 ENCOUNTER — Ambulatory Visit: Admitting: Orthopaedic Surgery

## 2024-05-28 DIAGNOSIS — Z96652 Presence of left artificial knee joint: Secondary | ICD-10-CM | POA: Insufficient documentation

## 2024-05-28 DIAGNOSIS — M2550 Pain in unspecified joint: Secondary | ICD-10-CM | POA: Diagnosis not present
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
## 2024-05-28 NOTE — Telephone Encounter (Signed)
 Dr.Xu wants patient discharged from practice due to rude behavior.

## 2024-06-05 ENCOUNTER — Ambulatory Visit: Admitting: Rheumatology

## 2024-06-05 ENCOUNTER — Encounter: Payer: Self-pay | Admitting: Radiology

## 2024-07-08 ENCOUNTER — Telehealth: Payer: Self-pay | Admitting: Orthopaedic Surgery

## 2024-07-08 NOTE — Telephone Encounter (Signed)
 DISCHARGE LETTER TO PATIENT MAILED CERTIFIED, RETURNED UNCLAIMED

## 2024-09-04 ENCOUNTER — Ambulatory Visit: Admitting: Rheumatology

## 2025-05-02 ENCOUNTER — Encounter: Admitting: Medical
